# Patient Record
Sex: Female | Born: 1975 | Race: Black or African American | Hispanic: No | Marital: Single | Smoking: Never smoker
Health system: Southern US, Community
[De-identification: ages and names within clinical notes are randomized; demographics above are authoritative.]

## PROBLEM LIST (undated history)

## (undated) DIAGNOSIS — N809 Endometriosis, unspecified: Secondary | ICD-10-CM

## (undated) DIAGNOSIS — D259 Leiomyoma of uterus, unspecified: Secondary | ICD-10-CM

## (undated) DIAGNOSIS — D649 Anemia, unspecified: Secondary | ICD-10-CM

## (undated) DIAGNOSIS — R7989 Other specified abnormal findings of blood chemistry: Secondary | ICD-10-CM

## (undated) DIAGNOSIS — R7309 Other abnormal glucose: Secondary | ICD-10-CM

## (undated) DIAGNOSIS — N739 Female pelvic inflammatory disease, unspecified: Secondary | ICD-10-CM

## (undated) DIAGNOSIS — A599 Trichomoniasis, unspecified: Secondary | ICD-10-CM

## (undated) HISTORY — DX: Other abnormal glucose: R73.09

## (undated) HISTORY — DX: Anemia, unspecified: D64.9

## (undated) HISTORY — PX: LAPAROSCOPIC ENDOMETRIOSIS FULGURATION: SUR769

## (undated) HISTORY — DX: Other specified abnormal findings of blood chemistry: R79.89

---

## 2009-05-24 ENCOUNTER — Inpatient Hospital Stay (HOSPITAL_COMMUNITY): Admission: AD | Admit: 2009-05-24 | Discharge: 2009-05-24 | Payer: Self-pay | Admitting: Obstetrics & Gynecology

## 2009-06-23 ENCOUNTER — Encounter: Payer: Self-pay | Admitting: Obstetrics and Gynecology

## 2009-06-23 ENCOUNTER — Ambulatory Visit: Payer: Self-pay | Admitting: Obstetrics and Gynecology

## 2009-07-21 ENCOUNTER — Ambulatory Visit: Payer: Self-pay | Admitting: Obstetrics and Gynecology

## 2009-09-18 ENCOUNTER — Emergency Department (HOSPITAL_COMMUNITY): Admission: EM | Admit: 2009-09-18 | Discharge: 2009-09-18 | Payer: Self-pay | Admitting: Emergency Medicine

## 2009-11-02 ENCOUNTER — Emergency Department (HOSPITAL_COMMUNITY): Admission: EM | Admit: 2009-11-02 | Discharge: 2009-11-02 | Payer: Self-pay | Admitting: Emergency Medicine

## 2010-03-30 ENCOUNTER — Inpatient Hospital Stay (HOSPITAL_COMMUNITY): Admission: AD | Admit: 2010-03-30 | Discharge: 2010-03-30 | Payer: Self-pay | Admitting: Family Medicine

## 2010-07-02 ENCOUNTER — Emergency Department (HOSPITAL_COMMUNITY): Admission: EM | Admit: 2010-07-02 | Discharge: 2010-07-02 | Payer: Self-pay | Admitting: Emergency Medicine

## 2010-08-11 ENCOUNTER — Emergency Department (HOSPITAL_COMMUNITY): Admission: EM | Admit: 2010-08-11 | Discharge: 2010-08-12 | Payer: Self-pay | Admitting: Emergency Medicine

## 2011-03-16 LAB — CBC
HCT: 34.5 % — ABNORMAL LOW (ref 36.0–46.0)
Platelets: 305 10*3/uL (ref 150–400)

## 2011-03-16 LAB — GC/CHLAMYDIA PROBE AMP, GENITAL: GC Probe Amp, Genital: NEGATIVE

## 2011-03-16 LAB — COMPREHENSIVE METABOLIC PANEL
ALT: 10 U/L (ref 0–35)
BUN: 12 mg/dL (ref 6–23)
CO2: 27 mEq/L (ref 19–32)
Calcium: 8.7 mg/dL (ref 8.4–10.5)
Chloride: 106 mEq/L (ref 96–112)
GFR calc Af Amer: >60 mL/min (ref >60)
Glucose, Bld: 97 mg/dL (ref 70–99)

## 2011-03-16 LAB — DIFFERENTIAL
Basophils Absolute: 0 10*3/uL (ref 0.0–0.1)
Basophils Relative: 0 % (ref 0–1)
Lymphs Abs: 2.9 10*3/uL (ref 0.7–4.0)
Monocytes Relative: 5 % (ref 3–12)
Neutro Abs: 5.2 10*3/uL (ref 1.7–7.7)
Neutrophils Relative %: 59 % (ref 43–77)

## 2011-03-16 LAB — URINE MICROSCOPIC-ADD ON

## 2011-03-16 LAB — URINALYSIS, ROUTINE W REFLEX MICROSCOPIC
Leukocytes, UA: NEGATIVE
Protein, ur: NEGATIVE mg/dL
Urobilinogen, UA: 1 mg/dL (ref 0.0–1.0)
pH: 6.5 (ref 5.0–8.0)

## 2011-03-16 LAB — POCT PREGNANCY, URINE: Preg Test, Ur: NEGATIVE

## 2011-03-16 LAB — LIPASE, BLOOD: Lipase: 59 U/L (ref 11–59)

## 2011-03-16 LAB — WET PREP, GENITAL

## 2011-03-26 LAB — URINALYSIS, ROUTINE W REFLEX MICROSCOPIC
Bilirubin Urine: NEGATIVE
Glucose, UA: NEGATIVE mg/dL
Hgb urine dipstick: NEGATIVE
Ketones, ur: NEGATIVE mg/dL
Nitrite: NEGATIVE
Urobilinogen, UA: 0.2 mg/dL (ref 0.0–1.0)
pH: 6 (ref 5.0–8.0)

## 2011-03-26 LAB — CBC
MCV: 83.2 fL (ref 78.0–100.0)
Platelets: 294 10*3/uL (ref 150–400)
RDW: 14.4 % (ref 11.5–15.5)

## 2011-03-26 LAB — WET PREP, GENITAL
Clue Cells Wet Prep HPF POC: NONE SEEN
Trich, Wet Prep: NONE SEEN
Yeast Wet Prep HPF POC: NONE SEEN

## 2011-03-26 LAB — DIFFERENTIAL
Eosinophils Absolute: 0.1 10*3/uL (ref 0.0–0.7)
Eosinophils Relative: 1 % (ref 0–5)
Monocytes Relative: 4 % (ref 3–12)
Neutro Abs: 6.5 10*3/uL (ref 1.7–7.7)
Neutrophils Relative %: 68 % (ref 43–77)

## 2011-04-04 LAB — URINALYSIS, ROUTINE W REFLEX MICROSCOPIC
Glucose, UA: NEGATIVE mg/dL
Ketones, ur: 15 mg/dL — AB
Nitrite: NEGATIVE
Protein, ur: NEGATIVE mg/dL
Urobilinogen, UA: 1 mg/dL (ref 0.0–1.0)

## 2011-04-04 LAB — WET PREP, GENITAL: Yeast Wet Prep HPF POC: NONE SEEN

## 2011-04-04 LAB — URINE MICROSCOPIC-ADD ON

## 2011-04-04 LAB — GC/CHLAMYDIA PROBE AMP, GENITAL: GC Probe Amp, Genital: NEGATIVE

## 2011-04-10 LAB — WET PREP, GENITAL
Trich, Wet Prep: NONE SEEN
Yeast Wet Prep HPF POC: NONE SEEN

## 2011-04-10 LAB — GC/CHLAMYDIA PROBE AMP, GENITAL
Chlamydia, DNA Probe: NEGATIVE
GC Probe Amp, Genital: NEGATIVE

## 2011-04-10 LAB — URINALYSIS, ROUTINE W REFLEX MICROSCOPIC
Bilirubin Urine: NEGATIVE
Glucose, UA: NEGATIVE mg/dL
Ketones, ur: NEGATIVE mg/dL
Protein, ur: NEGATIVE mg/dL
pH: 5 (ref 5.0–8.0)

## 2011-05-15 NOTE — Group Therapy Note (Signed)
Dawn Lyons, Dawn Lyons NO.:  [id number]   MEDICAL RECORD NO.:  [id number]          PATIENT TYPE:  WOC   LOCATION:  [facility]                   FACILITY:  [facility]   PHYSICIAN:  Wynelle Bourgeois, CNM    DATE OF BIRTH:  Jan 18, 1976   DATE OF SERVICE:  07/21/2009                                  CLINIC NOTE   This is a return followup visit for a 35 year old, gravida 0, who  presented in June with complaints of dyspareunia, menorrhagia, and  dysmenorrhea for 20-month.  Her primary complaint had been dyspareunia.  She had a past history of endometriosis treated with a laparoscopy in  2001 with no further treatment since then.  She had a 5-month history of  what she believes to be menorrhagia which was a 3-day period with heavy  bleeding on the first day.  She had believed that this was heavier  bleeding than she has ever had, and so she was given options by Dr. Okey Dupre  of laparoscopy Depo-Lupron and Depo-Provera.  She returns today to  discuss her decision.  She has nail after reviewing all of the options.  Decided to proceed with Depo-Provera for her treatment.  Her last  menstrual period this month was on July 16, 2009, and was not as heavy  as it was felt the month before, and she had only her usual amount of  dysmenorrhea.  She has not had intercourse since her last visit, so this  could not address dyspareunia, but [facility], and will return here in 2 months to  evaluate the effectiveness of said treatment and the patient has no  further questions.           ______________________________  Wynelle Bourgeois, CNM     MW/MEDQ  D:  07/21/2009  T:  07/22/2009  Job:  [id number]

## 2011-05-15 NOTE — Group Therapy Note (Signed)
NAME:  Dawn Lyons, Dawn Lyons NO.:  [id number]   MEDICAL RECORD NO.:  [id number]          PATIENT TYPE:  WOC   LOCATION:  [facility]                   FACILITY:  [facility]   PHYSICIAN:  Wynelle Bourgeois, CNM    DATE OF BIRTH:  Aug 27, 1976   DATE OF SERVICE:                                  CLINIC NOTE   SUBJECTIVE DATA:  This is a 35 year old gravida 0 who presents to the  clinic with complaints of dyspareunia, as well as menorrhagia and  dysmenorrhea x1 month.  Her primary complaint is dyspareunia.  She has a  past history of endometriosis which was treated with laparoscopy in 2001  but has had no further treatment since then.  She has a history of  starting her menses at age 81 and has always had dysmenorrhea with her  periods but the 1 episode of heavier bleeding occurred this past month  with a total period of 3 days but she believed that the flow was heavier  than normal necessitating pad change every 3 hours and the emergence of  dyspareunia bilaterally over the last month.  She is in a same-sex  relationship and reports having dyspareunia every time she has  intercourse with her partner.  She has other history remarkable for  trichomoniasis in the remote past and history of ovarian cysts in the  past.  Her only surgery was laparoscopy in 2001.  She has not had a Pap  in several years.   OBJECTIVE DATA:  VITAL SIGNS:  Stable.  Afebrile.  Blood pressure  115/75.  Weight is 164 with a height of 5.2.  HEENT:  Within normal limits.  Thyroid normal.  CHEST:  Clear.  HEART:  Regular rate and rhythm.  NEURO:  Grossly intact with normal DTRs.  ABDOMEN:  Soft and nontender, nondistended with positive bowel sounds.  EXTREMITIES:  Normal.  PELVIC:  Exam is remarkable for normal vaginal rugae with nullip cervix.  Pap smear was obtained.  Pelvic bimanual exam shows a normal anteverted  uterus, slightly enlarged at about a 6-week size.  Bilateral ovaries are  not readily palpable.   There is tenderness in bilateral adnexa.  There  is no cervical motion tenderness.  RECTAL:  Deferred.   ASSESSMENT:  1. Nullipara with history of endometriosis.  2. Dyspareunia, new.  3. Menorrhagia x1 month.  4. Dysmenorrhea.   PLAN:  Dr. Okey Dupre was in to counsel with the patient.  His recommendations  began with outlining possible treatments for these issues which are felt  to be related to her endometriosis which has probably progressed in the  years since her surgery.  He discussed possibilities of surgery which is  not recommended at this time as it would probably provide only temporary  relief.  He also discussed the possibility of using Depot Lupron which  would provide relief for about a year but it is a very expensive  treatment and causes many uncomfortable side effects.  Finally, his  recommendation was to use Depo-Provera for approximately 1 year in order  to allow the endometriosis to regress and possibly give a more permanent  relief of her symptoms.  She has been given the website for the [place] [employer] article on endometriosis and will go home  and discuss with her partner the different options and  return in 2 to 4 weeks with her decision.  Recommendations were also  made to avoid deep penetration with intercourse and use ibuprofen as  needed for pain.           ______________________________  Wynelle Bourgeois, CNM     MW/MEDQ  D:  06/23/2009  T:  06/23/2009  Job:  [id number]

## 2011-07-27 IMAGING — US US PELVIS COMPLETE MODIFY
1 series · 13 of 25 positions shown · non-contrast
Comparison: 05/24/2009

CLINICAL DATA: Abdominal pain.  History of endometriosis.  LMP
02/05/2010

TRANSABDOMINAL AND TRANSVAGINAL ULTRASOUND OF PELVIS
TECHNIQUE: Both transabdominal and transvaginal ultrasound
examinations of the pelvis were performed including evaluation of
the uterus, ovaries, adnexal regions, and pelvic cul-de-sac.

[Series 1: us pelvis complete modify · 0.22mm/px · 66 acquisitions, 13 frames shown]
[im 1/66]
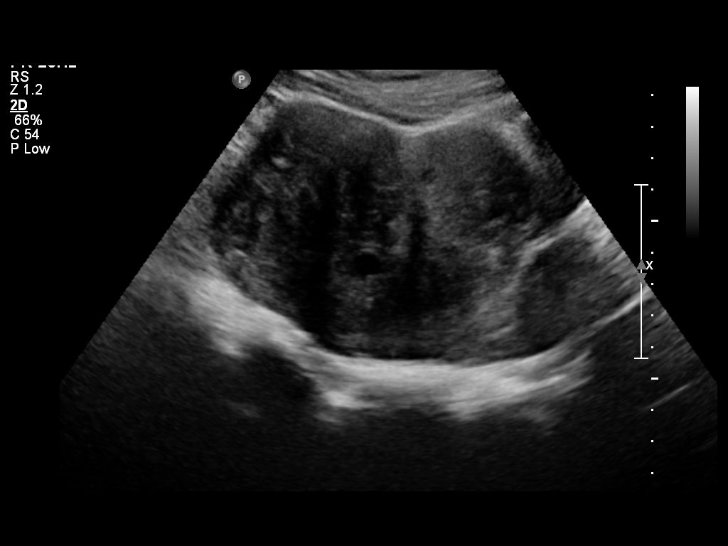
[im 6/66]
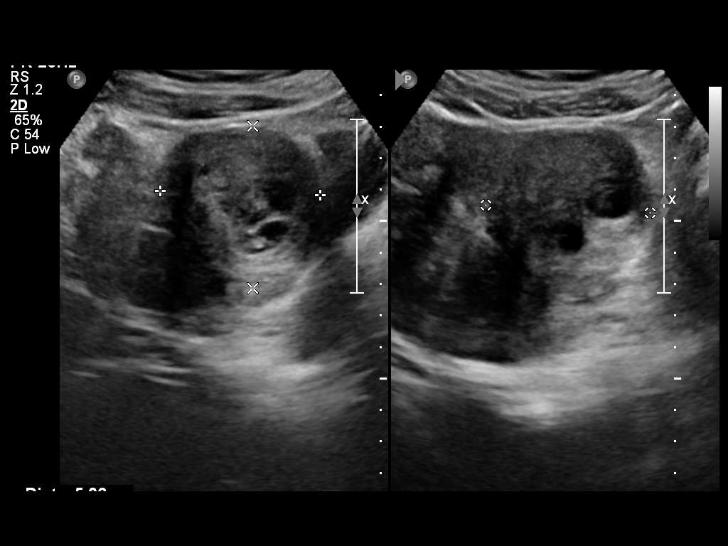
[im 11/66]
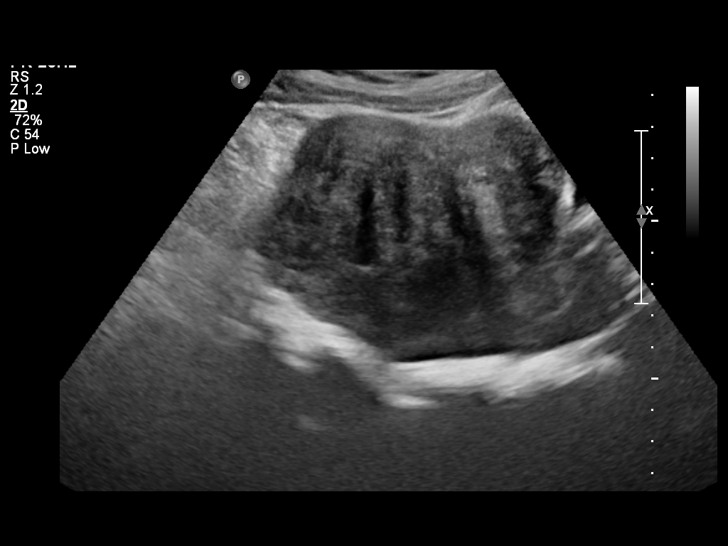
[im 17/66]
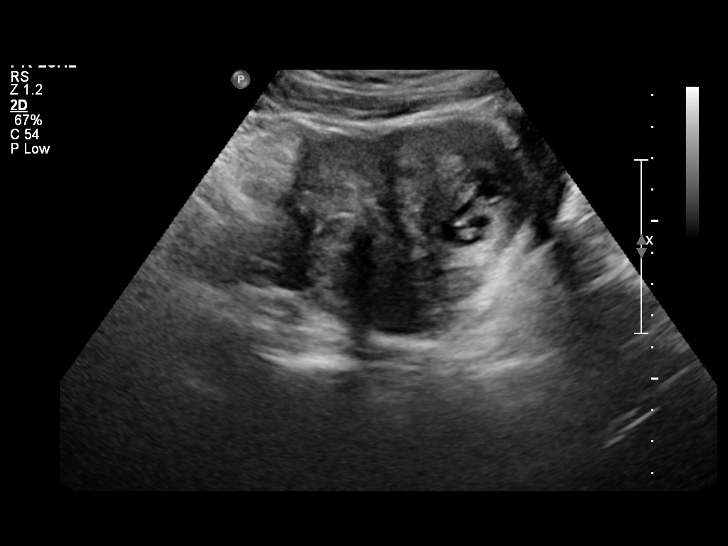
[im 22/66]
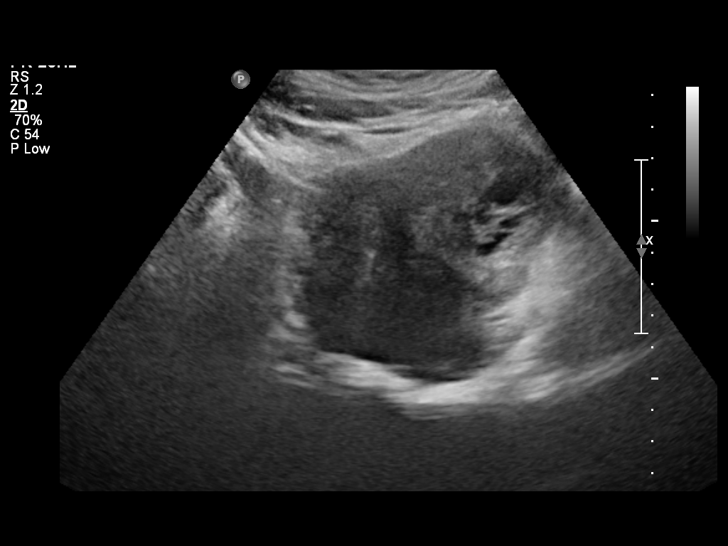
[im 28/66]
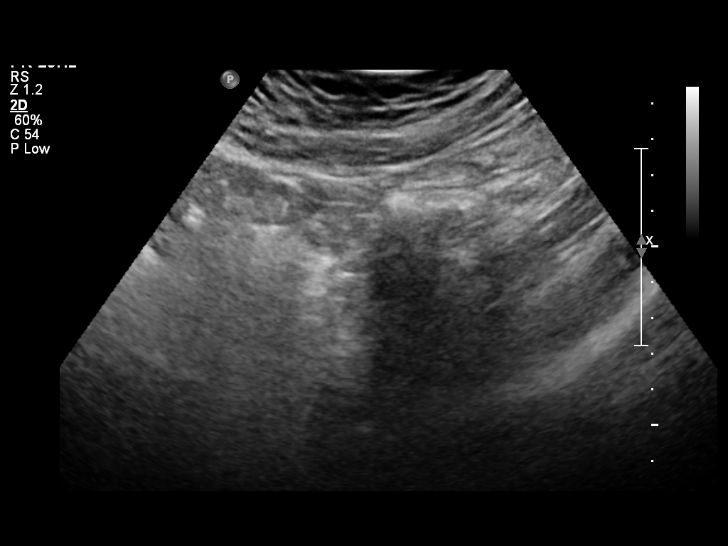
[im 33/66]
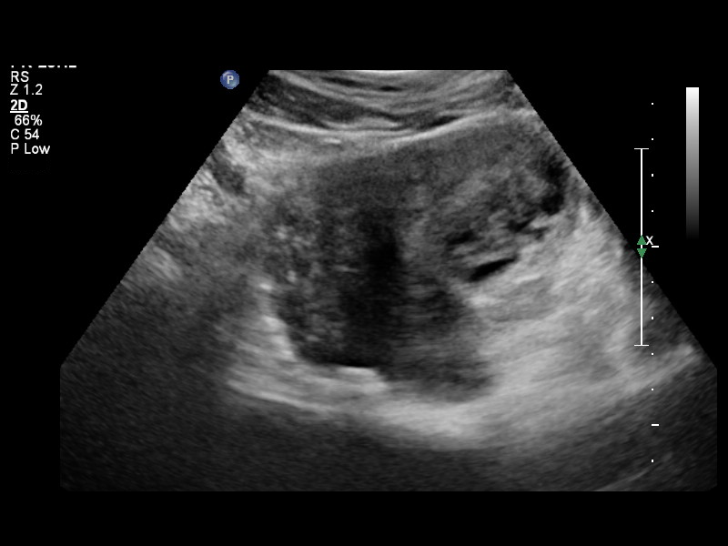
[im 38/66]
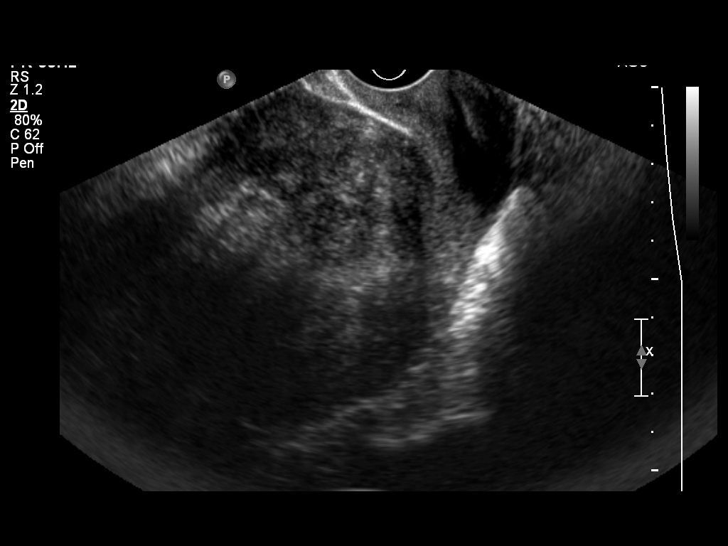
[im 44/66]
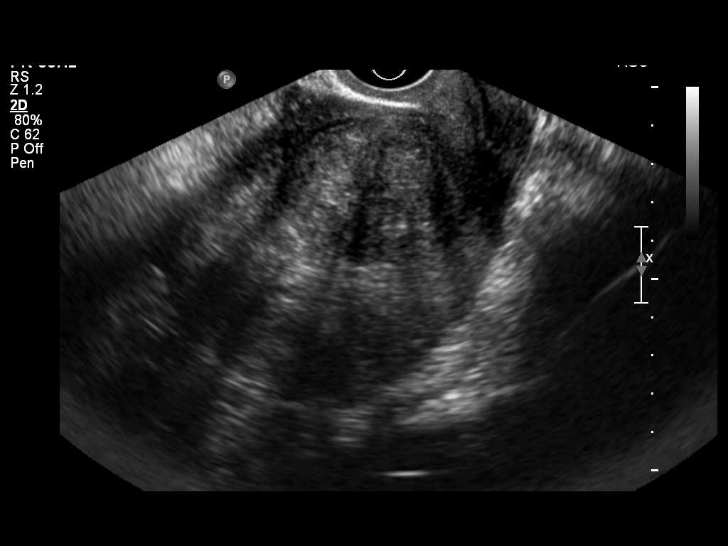
[im 49/66]
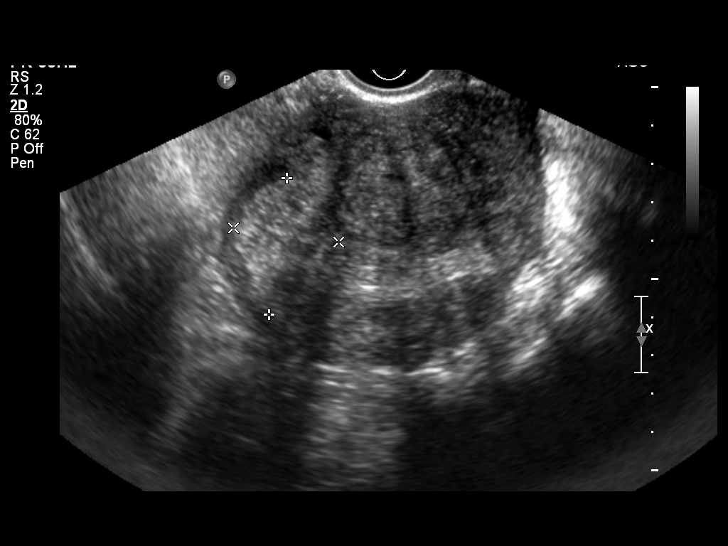
[im 55/66]
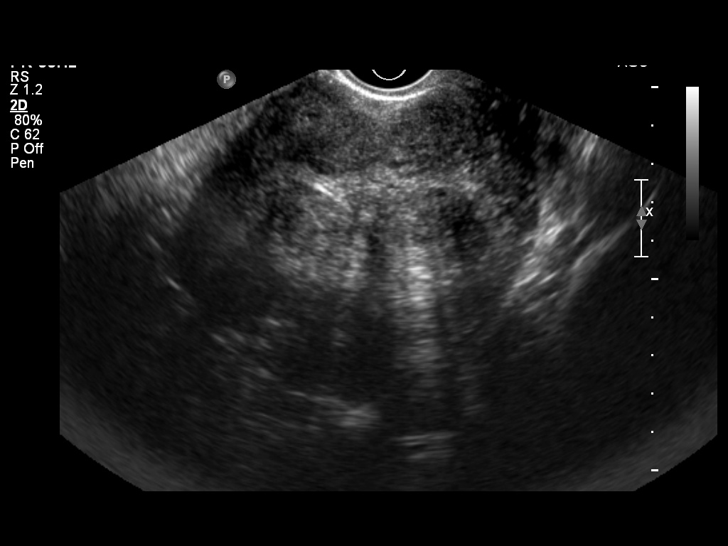
[im 60/66]
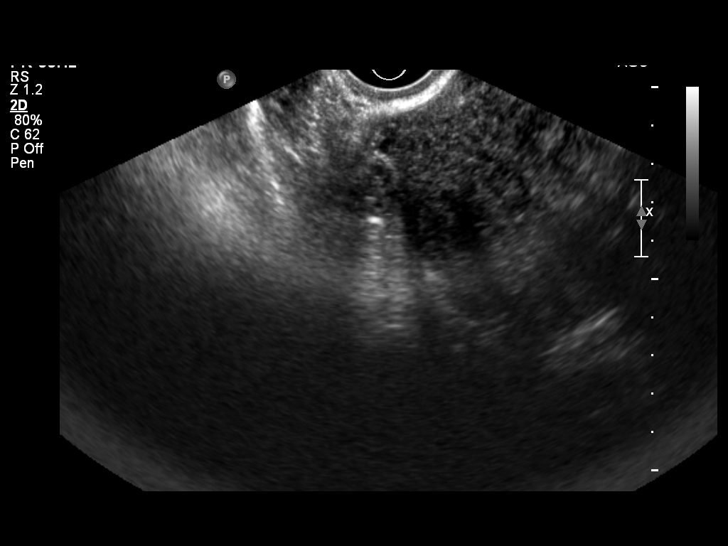
[im 66/66]
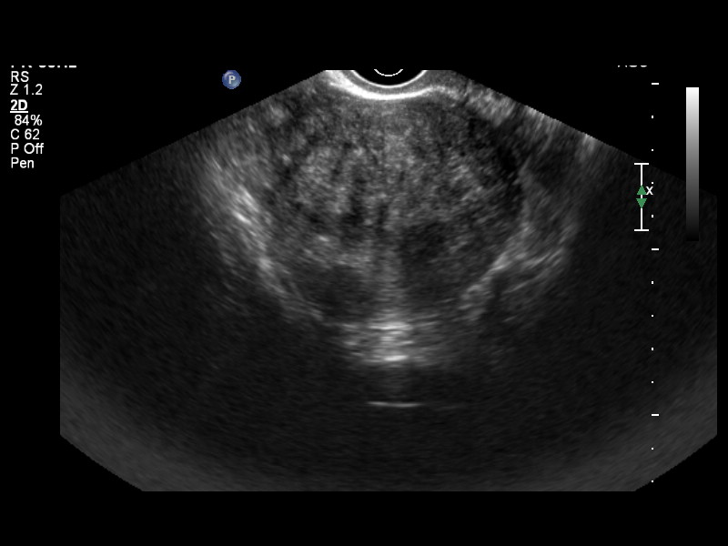

[13 of 25 positions shown; findings below may reference images not displayed]

FINDINGS: Uterus the uterus is enlarged with a sagittal length of 11.2 cm, an
AP width of 7.9 cm and a transverse width of 9.5 cm.  There are
multiple focal fibroids identified with size and locations as
noted:  In the left lateral mid body measuring 5.1 x 5.1 x 5.2 cm
and demonstrating some central degeneration; in the right
posterolateral fundal region measuring 3.3 x 3.4 x 3.1 cm and in
the left lateral upper uterine segment with a partial subserosal
component measuring 2.2 x 2.4 x 2.4 cm.  More diffuse fibroid
involvement is suspected.

Endometrium is poorly delineated secondary to the fibroid load

Right Ovary has a normal appearance transvaginally measuring 3.7 x
1.6 x 1.8 cm

Left Ovary seen only transabdominally measuring 3.2 x 2.1 x 2.5 cm

Other Findings:  No pelvic fluid or separate adnexal masses are
seen.
IMPRESSION: Fibroid uterus with measurable fibroid sizes and locations as noted
above.  The largest fibroid demonstrates some central necrosis
which is new in comparison with prior exam. More diffuse fibroid
involvement is suspected.

Endometrial assessment precluded by fibroid load.

Normal ovaries.

## 2013-02-15 ENCOUNTER — Encounter (HOSPITAL_COMMUNITY): Payer: Self-pay | Admitting: Emergency Medicine

## 2013-02-15 ENCOUNTER — Emergency Department (HOSPITAL_COMMUNITY)
Admission: EM | Admit: 2013-02-15 | Discharge: 2013-02-15 | Disposition: A | Discharge status: Home / Self Care | Payer: Self-pay | Attending: Emergency Medicine | Admitting: Emergency Medicine

## 2013-02-15 DIAGNOSIS — A5901 Trichomonal vulvovaginitis: Secondary | ICD-10-CM

## 2013-02-15 DIAGNOSIS — Z79899 Other long term (current) drug therapy: Secondary | ICD-10-CM | POA: Insufficient documentation

## 2013-02-15 DIAGNOSIS — N760 Acute vaginitis: Secondary | ICD-10-CM

## 2013-02-15 DIAGNOSIS — Z3202 Encounter for pregnancy test, result negative: Secondary | ICD-10-CM | POA: Insufficient documentation

## 2013-02-15 DIAGNOSIS — Z8742 Personal history of other diseases of the female genital tract: Secondary | ICD-10-CM | POA: Insufficient documentation

## 2013-02-15 HISTORY — DX: Endometriosis, unspecified: N80.9

## 2013-02-15 HISTORY — DX: Female pelvic inflammatory disease, unspecified: N73.9

## 2013-02-15 LAB — URINALYSIS, ROUTINE W REFLEX MICROSCOPIC
Hgb urine dipstick: NEGATIVE
Ketones, ur: 15 mg/dL — AB
Nitrite: NEGATIVE
Protein, ur: NEGATIVE mg/dL
Urobilinogen, UA: 0.2 mg/dL (ref 0.0–1.0)

## 2013-02-15 LAB — POCT PREGNANCY, URINE: Preg Test, Ur: NEGATIVE

## 2013-02-15 LAB — URINE MICROSCOPIC-ADD ON

## 2013-02-15 LAB — WET PREP, GENITAL: Clue Cells Wet Prep HPF POC: NONE SEEN

## 2013-02-15 MED ORDER — LIDOCAINE HCL (PF) 1 % IJ SOLN
INTRAMUSCULAR | Status: AC
  Filled 2013-02-15: qty 5

## 2013-02-15 MED ORDER — CEFTRIAXONE SODIUM 250 MG IJ SOLR
250.0000 mg | Freq: Once | INTRAMUSCULAR | Status: AC
  Administered 2013-02-15: 250 mg via INTRAMUSCULAR
  Filled 2013-02-15: qty 250

## 2013-02-15 MED ORDER — METRONIDAZOLE 500 MG PO TABS: 500.0000 mg | ORAL_TABLET | Freq: Two times a day (BID) | ORAL | Status: DC

## 2013-02-15 MED ORDER — AZITHROMYCIN 250 MG PO TABS
1000.0000 mg | ORAL_TABLET | Freq: Once | ORAL | Status: AC
  Administered 2013-02-15: 1000 mg via ORAL
  Filled 2013-02-15: qty 4

## 2013-02-15 NOTE — ED Provider Notes (Signed)
History     CSN: [id number]  Arrival date & time 02/15/13  1129   First MD Initiated Contact with Patient 02/15/13 1217      Chief Complaint  Patient presents with  . Vaginal Discharge    (Consider location/radiation/quality/duration/timing/severity/associated sxs/prior treatment) Patient is a 37 y.o. female presenting with vaginal discharge. The history is provided by the patient.  Vaginal Discharge Pertinent negatives include no abdominal pain.  pt c/o vaginal discharge for the past 2-3 weeks, brownish. Also c/o mild vaginal pain/irritation, worse w   intercourse. Hx uterine fibroids. No hx ovarian cysts or endometriosis. Denies known std exposure. No abd pain. No flank pain. No dysuria or hematuria. lnmp 2 weeks ago. Denies fever or chills. No nv.     Past Medical History  Diagnosis Date  . Endometriosis   . PID (pelvic inflammatory disease)     Past Surgical History  Procedure Laterality Date  . Laparoscopic endometriosis fulguration      No family history on file.  History  Substance Use Topics  . Smoking status: Never Smoker   . Smokeless tobacco: Not on file  . Alcohol Use: No    OB History   Grav Para Term Preterm Abortions TAB SAB Ect Mult Living                  Review of Systems  Constitutional: Negative for fever and chills.  HENT: Negative for sore throat.   Eyes: Negative for redness.  Gastrointestinal: Negative for vomiting, abdominal pain, diarrhea, constipation and abdominal distention.  Endocrine: Negative for polyuria.  Genitourinary: Positive for vaginal discharge. Negative for dysuria and flank pain.  Skin: Negative for rash.    Allergies  Review of patient's allergies indicates no known allergies.  Home Medications   Current Outpatient Rx  Name  Route  Sig  Dispense  Refill  . naproxen sodium (ANAPROX) 220 MG tablet   Oral   Take 440 mg by mouth daily as needed (headache).           BP 131/81  Pulse 83  Temp(Src) 98.1 F  (36.7 C) (Oral)  Resp 18  SpO2 100%  LMP 02/10/2013  Physical Exam  Nursing note and vitals reviewed. Constitutional: She appears well-developed and well-nourished. No distress.  Eyes: Conjunctivae are normal. No scleral icterus.  Neck: Neck supple. No tracheal deviation present.  Cardiovascular: Normal rate.   Pulmonary/Chest: Effort normal. No respiratory distress.  Abdominal: Soft. Normal appearance and bowel sounds are normal. She exhibits no distension and no mass. There is no tenderness. There is no rebound and no guarding.  Genitourinary:  No cva tenderness. Normal ext exam. Whitish/yellow d/c. Cervix closed. No cmt. No adx masses/ tenderness  Musculoskeletal: She exhibits no edema.  Neurological: She is alert.  Skin: Skin is warm and dry. No rash noted.  Psychiatric: She has a normal mood and affect.    ED Course  Procedures (including critical care time)   Results for orders placed during the hospital encounter of 02/15/13  WET PREP, GENITAL      Result Value Range   Yeast Wet Prep HPF POC NONE SEEN  NONE SEEN   Trich, Wet Prep MANY (*) NONE SEEN   Clue Cells Wet Prep HPF POC NONE SEEN  NONE SEEN   WBC, Wet Prep HPF POC FEW (*) NONE SEEN  POCT PREGNANCY, URINE      Result Value Range   Preg Test, Ur NEGATIVE  NEGATIVE  MDM  Labs.  Pelvic.  Reviewed nursing notes and prior charts for additional history.   Rocephin im, zithromax po.   rx flagyl for trich.  pcp f/u.   abd  Soft nt. No nv. Pt appears stable for d/c.      Suzi Roots, MD 02/15/13 1444

## 2013-02-15 NOTE — ED Notes (Addendum)
Pt c/o brown vaginal discharge and painful intercourse for about 3 weeks. Pt reports sour odor smell noted and urinary frequency. Pt has new sexual partner.

## 2013-02-15 NOTE — ED Notes (Signed)
States "I have abd pain and a funky discharge." Pt states d/c is brown and has foul odor. Pt states she is also having "pressure pain with intercourse."

## 2013-02-16 LAB — GC/CHLAMYDIA PROBE AMP: GC Probe RNA: NEGATIVE

## 2013-04-03 ENCOUNTER — Emergency Department (HOSPITAL_COMMUNITY)
Admission: EM | Admit: 2013-04-03 | Discharge: 2013-04-04 | Disposition: A | Discharge status: Home / Self Care | Payer: Self-pay | Attending: Emergency Medicine | Admitting: Emergency Medicine

## 2013-04-03 ENCOUNTER — Encounter (HOSPITAL_COMMUNITY): Payer: Self-pay | Admitting: *Deleted

## 2013-04-03 DIAGNOSIS — A599 Trichomoniasis, unspecified: Secondary | ICD-10-CM

## 2013-04-03 DIAGNOSIS — Z3202 Encounter for pregnancy test, result negative: Secondary | ICD-10-CM | POA: Insufficient documentation

## 2013-04-03 DIAGNOSIS — Z113 Encounter for screening for infections with a predominantly sexual mode of transmission: Secondary | ICD-10-CM | POA: Insufficient documentation

## 2013-04-03 DIAGNOSIS — A5901 Trichomonal vulvovaginitis: Secondary | ICD-10-CM | POA: Insufficient documentation

## 2013-04-03 DIAGNOSIS — R102 Pelvic and perineal pain: Secondary | ICD-10-CM

## 2013-04-03 DIAGNOSIS — R110 Nausea: Secondary | ICD-10-CM | POA: Insufficient documentation

## 2013-04-03 DIAGNOSIS — Z8742 Personal history of other diseases of the female genital tract: Secondary | ICD-10-CM | POA: Insufficient documentation

## 2013-04-03 DIAGNOSIS — N949 Unspecified condition associated with female genital organs and menstrual cycle: Secondary | ICD-10-CM | POA: Insufficient documentation

## 2013-04-03 HISTORY — DX: Trichomoniasis, unspecified: A59.9

## 2013-04-03 LAB — URINALYSIS, ROUTINE W REFLEX MICROSCOPIC
Bilirubin Urine: NEGATIVE
Ketones, ur: NEGATIVE mg/dL
Nitrite: NEGATIVE
Protein, ur: NEGATIVE mg/dL
pH: 5 (ref 5.0–8.0)

## 2013-04-03 NOTE — ED Notes (Signed)
Pt called for room X2 

## 2013-04-03 NOTE — ED Provider Notes (Signed)
History     CSN: [id number]  Arrival date & time 04/03/13  1932   First MD Initiated Contact with Patient 04/03/13 2213      Chief Complaint  Patient presents with  . Vaginal Discharge    (Consider location/radiation/quality/duration/timing/severity/associated sxs/prior treatment) Patient is a 37 y.o. female presenting with vaginal discharge. The history is provided by the patient, medical records and a significant other. No language interpreter was used.  Vaginal Discharge This is a new problem. The current episode started in the past 7 days. The problem occurs constantly. The problem has been gradually worsening. Associated symptoms include nausea. Pertinent negatives include no chest pain, diaphoresis, fatigue, fever, neck pain, rash, urinary symptoms, vomiting or weakness. Nothing aggravates the symptoms. She has tried nothing for the symptoms. The treatment provided no relief.   Dawn Lyons is a 37 y.o. female  with a hx of STD, PID presents to the Emergency Department complaining of gradual, persistent, gradually worsening vaginal malodorous discharge.  Patient states she was seen and treated for trichomoniasis 2 weeks ago but her partner was not treated and they have continued to have unprotected intercourse. She is sexually active with 1 female partner the last 3 months and they do not use protection. Associated symptoms include intermittent, resolved pelvic pain.  Nothing makes it better and nothing makes it worse.  Pt denies fever, chills, neck pain, chest pain, shortness of breath, abdominal pain, nausea, vomiting, diarrhea to weakness, dizziness, dysuria, hematuria, syncope.     Past Medical History  Diagnosis Date  . Endometriosis   . PID (pelvic inflammatory disease)   . Trichimoniasis     Past Surgical History  Procedure Laterality Date  . Laparoscopic endometriosis fulguration      History reviewed. No pertinent family history.  History  Substance Use Topics  .  Smoking status: Never Smoker   . Smokeless tobacco: Not on file  . Alcohol Use: No    OB History   Grav Para Term Preterm Abortions TAB SAB Ect Mult Living                  Review of Systems  Constitutional: Negative for fever, diaphoresis, appetite change, fatigue and unexpected weight change.  HENT: Negative for neck pain and neck stiffness.   Respiratory: Negative for chest tightness and shortness of breath.   Cardiovascular: Negative for chest pain.  Gastrointestinal: Positive for nausea. Negative for vomiting, diarrhea, constipation, blood in stool, abdominal distention and rectal pain.  Genitourinary: Positive for vaginal discharge and pelvic pain (intermittent). Negative for dysuria, urgency, frequency, hematuria, flank pain, vaginal bleeding, difficulty urinating, vaginal pain and menstrual problem.  Musculoskeletal: Negative for back pain.  Skin: Negative for rash.  Neurological: Negative for dizziness, weakness and light-headedness.    Allergies  Review of patient's allergies indicates no known allergies.  Home Medications   Current Outpatient Rx  Name  Route  Sig  Dispense  Refill  . naproxen sodium (ANAPROX) 220 MG tablet   Oral   Take 440 mg by mouth daily as needed (headache).           BP 106/69  Pulse 90  Temp(Src) 98.4 F (36.9 C) (Oral)  Resp 18  SpO2 99%  LMP 03/27/2013  Physical Exam  Nursing note and vitals reviewed. Constitutional: She appears well-developed and well-nourished. No distress.  HENT:  Head: Normocephalic and atraumatic.  Mouth/Throat: Oropharynx is clear and moist.  Eyes: Conjunctivae and EOM are normal. Pupils are equal, round, and  reactive to light. No scleral icterus.  Neck: Normal range of motion. Neck supple.  Cardiovascular: Normal rate, regular rhythm, normal heart sounds and intact distal pulses.  Exam reveals no gallop and no friction rub.   No murmur heard. Pulmonary/Chest: Effort normal and breath sounds normal. No  respiratory distress. She has no wheezes. She has no rales. She exhibits no tenderness.  Abdominal: Soft. Bowel sounds are normal. She exhibits no distension and no mass. There is no hepatosplenomegaly. There is no tenderness. There is no rigidity, no rebound, no guarding and no CVA tenderness. Hernia confirmed negative in the right inguinal area and confirmed negative in the left inguinal area.  Genitourinary: Uterus normal. Pelvic exam was performed with patient prone. There is no rash, tenderness or lesion on the right labia. There is no rash, tenderness or lesion on the left labia. Uterus is not deviated, not enlarged, not fixed and not tender. Cervix exhibits no motion tenderness, no discharge and no friability. Right adnexum displays no mass and no tenderness. Left adnexum displays no mass and no tenderness. No erythema, tenderness or bleeding around the vagina. No foreign body around the vagina. No signs of injury around the vagina. Vaginal discharge (green, frothy, copious) found.  Musculoskeletal: Normal range of motion. She exhibits no edema.  Lymphadenopathy:    She has no cervical adenopathy.       Right: No inguinal adenopathy present.       Left: No inguinal adenopathy present.  Neurological: She is alert.  Speech is clear and goal oriented Moves extremities without ataxia  Skin: Skin is warm and dry. No rash noted. She is not diaphoretic.  Psychiatric: She has a normal mood and affect.    ED Course  Procedures (including critical care time)  Labs Reviewed  WET PREP, GENITAL - Abnormal; Notable for the following:    Trich, Wet Prep MANY (*)    Clue Cells Wet Prep HPF POC MANY (*)    WBC, Wet Prep HPF POC TOO NUMEROUS TO COUNT (*)    All other components within normal limits  GC/CHLAMYDIA PROBE AMP  URINALYSIS, ROUTINE W REFLEX MICROSCOPIC  POCT PREGNANCY, URINE   No results found.   1. Trichomonas   2. Screening examination for STD (sexually transmitted disease)   3.  Pelvic pain       MDM  Dawn Lyons presents with pelvic pain and STD exposure.  Patient treated for trichomoniasis and several weeks ago.  Her partner was never treated.  Patient with positive trichomoniasis and white blood cells too numerous to count on wet prep.  Likely further STD exposure. Pt understands that they have GC/Chlamydia cultures pending and that they will need to inform all sexual partners if results return positive. Pt has been treated prophylacticly with azithromycin and rocephin due to pts history, pelvic exam, and wet prep with increased WBCs. Patient also treated for trichomoniasis. Pt not concerning for PID because hemodynamically stable and no cervical motion tenderness on pelvic exam. Pt has also been treated with flagyl for Bacterial Vaginosis. Pt has been advised to not drink alcohol while on this medication. Patient to be discharged with instructions to follow up with OBGYN and/or obtain a primary care physician. Discussed importance of using protection when sexually active.         Dahlia Client Shawntee Mainwaring, PA-C 04/04/13 [phone]

## 2013-04-03 NOTE — ED Notes (Signed)
Pt c/o brown foul discharge x 2 weeks.  Also c/o lower abdominal pain, is nauseous but denies n/v. No CP, SOB, fever/chills.

## 2013-04-04 LAB — WET PREP, GENITAL

## 2013-04-04 MED ORDER — AZITHROMYCIN 250 MG PO TABS
1000.0000 mg | ORAL_TABLET | Freq: Once | ORAL | Status: AC
  Administered 2013-04-04: 1000 mg via ORAL
  Filled 2013-04-04: qty 4

## 2013-04-04 MED ORDER — LIDOCAINE HCL (PF) 1 % IJ SOLN
INTRAMUSCULAR | Status: AC
  Filled 2013-04-04: qty 5

## 2013-04-04 MED ORDER — METRONIDAZOLE 500 MG PO TABS
2000.0000 mg | ORAL_TABLET | Freq: Once | ORAL | Status: AC
  Administered 2013-04-04: 2000 mg via ORAL
  Filled 2013-04-04: qty 4

## 2013-04-04 MED ORDER — CEFTRIAXONE SODIUM 250 MG IJ SOLR
250.0000 mg | Freq: Once | INTRAMUSCULAR | Status: AC
  Administered 2013-04-04: 250 mg via INTRAMUSCULAR
  Filled 2013-04-04: qty 250

## 2013-04-04 NOTE — ED Provider Notes (Signed)
  Medical screening examination/treatment/procedure(s) were performed by non-physician practitioner and as supervising physician I was immediately available for consultation/collaboration.    Gerhard Munch, MD 04/04/13 [phone]

## 2013-04-05 LAB — GC/CHLAMYDIA PROBE AMP: GC Probe RNA: NEGATIVE

## 2013-05-27 ENCOUNTER — Emergency Department (HOSPITAL_COMMUNITY)
Admission: EM | Admit: 2013-05-27 | Discharge: 2013-05-27 | Disposition: A | Discharge status: Home / Self Care | Payer: Self-pay | Attending: Emergency Medicine | Admitting: Emergency Medicine

## 2013-05-27 ENCOUNTER — Encounter (HOSPITAL_COMMUNITY): Payer: Self-pay | Admitting: Physical Medicine and Rehabilitation

## 2013-05-27 DIAGNOSIS — Z3202 Encounter for pregnancy test, result negative: Secondary | ICD-10-CM | POA: Insufficient documentation

## 2013-05-27 DIAGNOSIS — Y9289 Other specified places as the place of occurrence of the external cause: Secondary | ICD-10-CM | POA: Insufficient documentation

## 2013-05-27 DIAGNOSIS — N898 Other specified noninflammatory disorders of vagina: Secondary | ICD-10-CM | POA: Insufficient documentation

## 2013-05-27 DIAGNOSIS — N390 Urinary tract infection, site not specified: Secondary | ICD-10-CM | POA: Insufficient documentation

## 2013-05-27 DIAGNOSIS — Y990 Civilian activity done for income or pay: Secondary | ICD-10-CM | POA: Insufficient documentation

## 2013-05-27 DIAGNOSIS — Y9389 Activity, other specified: Secondary | ICD-10-CM | POA: Insufficient documentation

## 2013-05-27 DIAGNOSIS — S51809A Unspecified open wound of unspecified forearm, initial encounter: Secondary | ICD-10-CM | POA: Insufficient documentation

## 2013-05-27 DIAGNOSIS — A599 Trichomoniasis, unspecified: Secondary | ICD-10-CM

## 2013-05-27 DIAGNOSIS — R109 Unspecified abdominal pain: Secondary | ICD-10-CM | POA: Insufficient documentation

## 2013-05-27 DIAGNOSIS — A5901 Trichomonal vulvovaginitis: Secondary | ICD-10-CM | POA: Insufficient documentation

## 2013-05-27 DIAGNOSIS — Z8619 Personal history of other infectious and parasitic diseases: Secondary | ICD-10-CM | POA: Insufficient documentation

## 2013-05-27 DIAGNOSIS — S51812A Laceration without foreign body of left forearm, initial encounter: Secondary | ICD-10-CM

## 2013-05-27 DIAGNOSIS — Z23 Encounter for immunization: Secondary | ICD-10-CM | POA: Insufficient documentation

## 2013-05-27 DIAGNOSIS — W278XXA Contact with other nonpowered hand tool, initial encounter: Secondary | ICD-10-CM | POA: Insufficient documentation

## 2013-05-27 DIAGNOSIS — Z8742 Personal history of other diseases of the female genital tract: Secondary | ICD-10-CM | POA: Insufficient documentation

## 2013-05-27 LAB — URINE MICROSCOPIC-ADD ON

## 2013-05-27 LAB — URINALYSIS, ROUTINE W REFLEX MICROSCOPIC
Bilirubin Urine: NEGATIVE
Hgb urine dipstick: NEGATIVE
Ketones, ur: NEGATIVE mg/dL
Nitrite: NEGATIVE
Urobilinogen, UA: 0.2 mg/dL (ref 0.0–1.0)

## 2013-05-27 LAB — WET PREP, GENITAL
Clue Cells Wet Prep HPF POC: NONE SEEN
Yeast Wet Prep HPF POC: NONE SEEN

## 2013-05-27 MED ORDER — NITROFURANTOIN MONOHYD MACRO 100 MG PO CAPS: 100.0000 mg | ORAL_CAPSULE | Freq: Two times a day (BID) | ORAL | Status: DC

## 2013-05-27 MED ORDER — AZITHROMYCIN 250 MG PO TABS
1000.0000 mg | ORAL_TABLET | Freq: Once | ORAL | Status: AC
  Administered 2013-05-27: 1000 mg via ORAL
  Filled 2013-05-27: qty 4

## 2013-05-27 MED ORDER — LIDOCAINE HCL (PF) 1 % IJ SOLN
INTRAMUSCULAR | Status: AC
  Filled 2013-05-27: qty 5

## 2013-05-27 MED ORDER — METRONIDAZOLE 500 MG PO TABS
2000.0000 mg | ORAL_TABLET | Freq: Once | ORAL | Status: AC
  Administered 2013-05-27: 2000 mg via ORAL
  Filled 2013-05-27: qty 4

## 2013-05-27 MED ORDER — CEFTRIAXONE SODIUM 250 MG IJ SOLR
250.0000 mg | Freq: Once | INTRAMUSCULAR | Status: AC
  Administered 2013-05-27: 250 mg via INTRAMUSCULAR
  Filled 2013-05-27: qty 250

## 2013-05-27 MED ORDER — PHENAZOPYRIDINE HCL 200 MG PO TABS: 200.0000 mg | ORAL_TABLET | Freq: Three times a day (TID) | ORAL | Status: DC

## 2013-05-27 MED ORDER — LIDOCAINE HCL (PF) 1 % IJ SOLN
1.0000 mL | Freq: Once | INTRAMUSCULAR | Status: AC
  Administered 2013-05-27: 1 mL

## 2013-05-27 MED ORDER — TETANUS-DIPHTH-ACELL PERTUSSIS 5-2.5-18.5 LF-MCG/0.5 IM SUSP
0.5000 mL | Freq: Once | INTRAMUSCULAR | Status: AC
  Administered 2013-05-27: 0.5 mL via INTRAMUSCULAR
  Filled 2013-05-27: qty 0.5

## 2013-05-27 NOTE — ED Notes (Signed)
Pt resting quietly at the time. Wound care performed to L arm laceration. Pt tolerated without difficulty. Vital signs stable. No signs of distress noted.

## 2013-05-27 NOTE — ED Provider Notes (Signed)
History     CSN: [id number]  Arrival date & time 05/27/13  0809   First MD Initiated Contact with Patient 05/27/13 [phone]      Chief Complaint  Patient presents with  . Arm Pain  . Vaginal Discharge    (Consider location/radiation/quality/duration/timing/severity/associated sxs/prior treatment) HPI Comments: 37 y.o. Female with PMHx of trichomoniasis and PID presents today with two complaints: small linear laceration to the left forearm that occurred yesterday when a box cutter slipped and cut her arm as she was opening boxes at work. And a brown, foul-smelling discharged that started shortly after her period and has lasted for a few days.   Patient is a 37 y.o. female presenting with arm pain and vaginal discharge.  Arm Pain This is a new problem. The current episode started yesterday. The problem occurs constantly. The problem has been unchanged. Associated symptoms include abdominal pain. Pertinent negatives include no arthralgias, change in bowel habit, chest pain, chills, coughing, fever, headaches, myalgias, nausea, neck pain, numbness, sore throat, visual change or vomiting. The symptoms are aggravated by exertion. She has tried nothing for the symptoms.  Vaginal Discharge Quality:  Manson Passey and malodorous Severity:  Mild Onset quality:  Sudden Timing:  Intermittent Progression:  Unchanged Chronicity:  Recurrent Relieved by:  Nothing Worsened by:  Nothing tried Ineffective treatments:  None tried Associated symptoms: abdominal pain   Associated symptoms: no dysuria, no fever, no nausea and no vomiting   Risk factors comment:  Pt has hx of trichomoniasis and BV   Past Medical History  Diagnosis Date  . Endometriosis   . PID (pelvic inflammatory disease)   . Trichimoniasis     Past Surgical History  Procedure Laterality Date  . Laparoscopic endometriosis fulguration      No family history on file.  History  Substance Use Topics  . Smoking status: Never Smoker   .  Smokeless tobacco: Not on file  . Alcohol Use: No    OB History   Grav Para Term Preterm Abortions TAB SAB Ect Mult Living                  Review of Systems  Constitutional: Negative for fever and chills.  HENT: Negative for sore throat and neck pain.   Eyes: Negative for visual disturbance.  Respiratory: Negative for cough.   Cardiovascular: Negative for chest pain.  Gastrointestinal: Positive for abdominal pain. Negative for nausea, vomiting and change in bowel habit.       Suprapubic  Genitourinary: Positive for vaginal discharge. Negative for dysuria, flank pain and genital sores.       Foul smelling brown discharge  Musculoskeletal: Negative for myalgias, back pain and arthralgias.  Skin: Positive for wound.       Left forearm  Neurological: Negative for numbness and headaches.    Allergies  Review of patient's allergies indicates no known allergies.  Home Medications   Current Outpatient Rx  Name  Route  Sig  Dispense  Refill  . naproxen sodium (ANAPROX) 220 MG tablet   Oral   Take 440 mg by mouth daily as needed (headache).           BP 120/68  Pulse 87  Temp(Src) 98.9 F (37.2 C) (Oral)  Resp 14  SpO2 100%  LMP 05/13/2013  Physical Exam  Nursing note and vitals reviewed. Constitutional: She is oriented to person, place, and time. She appears well-developed and well-nourished. No distress.  HENT:  Head: Normocephalic and atraumatic.  Eyes: Conjunctivae  and EOM are normal.  Neck: Normal range of motion. Neck supple.  No meningeal signs  Cardiovascular: Normal rate, regular rhythm and normal heart sounds.  Exam reveals no gallop and no friction rub.   No murmur heard. Pulmonary/Chest: Effort normal and breath sounds normal. No respiratory distress. She has no wheezes. She has no rales. She exhibits no tenderness.  Abdominal: Soft. Bowel sounds are normal. She exhibits no distension. There is tenderness. There is no rebound and no guarding.  Mild  suprapubic tenderness on palpation. No pain at McBurney's point, no Rovsing's sign  Genitourinary:  No external lesions. Mucopurulent discharge in vaginal canal. No CMT or adnexal tenderness on exam. Chaperone present.   Musculoskeletal: Normal range of motion. She exhibits no edema and no tenderness.  FROM to upper and lower extremities No step-offs noted on C-spine No tenderness to palpation of the spinous processes of the C-spine, T-spine or L-spine Full range of motion of C-spine, T-spine or L-spine Mild tenderness to palpation of the paraspinous muscles   Neurological: She is alert and oriented to person, place, and time. No cranial nerve deficit.  Speech is clear and goal oriented, follows commands Sensation normal to light touch and two point discrimination Moves extremities without ataxia, coordination intact Normal gait and balance Normal strength in upper and lower extremities bilaterally including dorsiflexion and plantar flexion, strong and equal grip strength   Skin: Skin is warm and dry. She is not diaphoretic. No erythema.  3 cm linear laceration noted on posterior left forearm proximal to the wrist. Bleeding controlled. No pus. No erythema. No scabbing. No foreign bodies appreciated.   Psychiatric: She has a normal mood and affect.    ED Course  Procedures (including critical care time) LACERATION REPAIR Performed by: Glade Nurse Authorized by: Glade Nurse Consent: Verbal consent obtained. Risks and benefits: risks, benefits and alternatives were discussed Consent given by: patient Patient identity confirmed: provided demographic data Prepped and Draped in normal sterile fashion Wound explored  Laceration Location: posterior left forearm  Laceration Length: 3 cm  No Foreign Bodies seen or palpated  Anesthesia: local infiltration  Local anesthetic: lidocaine 2% without epinephrine  Anesthetic total: 4 ml  Irrigation method: syringe Amount of cleaning:  standard  Skin closure: 4-0 prolene  Number of sutures: 3  Technique: simple interrupted  Patient tolerance: Patient tolerated the procedure well with no immediate complications.  Labs Reviewed  WET PREP, GENITAL - Abnormal; Notable for the following:    Trich, Wet Prep MANY (*)    WBC, Wet Prep HPF POC MANY (*)    All other components within normal limits  URINALYSIS, ROUTINE W REFLEX MICROSCOPIC - Abnormal; Notable for the following:    APPearance TURBID (*)    Leukocytes, UA MODERATE (*)    All other components within normal limits  URINE MICROSCOPIC-ADD ON - Abnormal; Notable for the following:    Squamous Epithelial / LPF MANY (*)    Bacteria, UA FEW (*)    All other components within normal limits  GC/CHLAMYDIA PROBE AMP  URINE CULTURE  POCT PREGNANCY, URINE   No results found. Medications  TDaP (BOOSTRIX) injection 0.5 mL (0.5 mLs Intramuscular Given 05/27/13 0846)  azithromycin (ZITHROMAX) tablet 1,000 mg (1,000 mg Oral Given 05/27/13 1055)  cefTRIAXone (ROCEPHIN) injection 250 mg (250 mg Intramuscular Given 05/27/13 1056)  lidocaine (PF) (XYLOCAINE) 1 % injection 1 mL (1 mL Other Given 05/27/13 1056)  metroNIDAZOLE (FLAGYL) tablet 2,000 mg (2,000 mg Oral Given 05/27/13 1102)  1. Urinary tract infection   2. Trichomoniasis    Discharge Medication List as of 05/27/2013 11:14 AM    START taking these medications   Details  nitrofurantoin, macrocrystal-monohydrate, (MACROBID) 100 MG capsule Take 1 capsule (100 mg total) by mouth 2 (two) times daily., Starting 05/27/2013, Until Discontinued, Print    phenazopyridine (PYRIDIUM) 200 MG tablet Take 1 tablet (200 mg total) by mouth 3 (three) times daily., Starting 05/27/2013, Until Discontinued, Print        Filed Vitals:   05/27/13 0816 05/27/13 1117  BP: 120/68 115/67  Pulse: 87 79  Temp: 98.9 F (37.2 C) 97.9 F (36.6 C)  TempSrc: Oral Oral  Resp: 14 18  SpO2: 100% 100%     MDM  36 y.o. Female with PMHx  of trichomoniasis and PID presents today with similar sx of pelvic pain and malodorous discharge. Pelvic exam revealed mild cervical tenderness, more likely cervicitis than PID given that pt is afebrile, not toxic looking and pain was mild. Patient to be discharged with instructions to follow up with OBGYN. Pt understands GC/Chlamydia cultures pending and that they will need to inform all sexual partners within the last 6 months if results return positive. Pt has been treated prophylacticly with azithromycin and rocephin due to pts history, pelvic exam, and wet prep with increased WBCs. Also given 2 g Flagyl. Pt has been advised to not drink alcohol while on this medication. Will also treat for UTI given moderate leukocytes.   Pt advised that she will receive a call in 48 hours if the test is positive and to refrain from sexual activity for 48 hours. If the test is positive, pt is advised to refrain from sexual activity for 10 days for the medicine to take effect.  Pt not concerning for PID because hemodynamically stable and no cervical motion tenderness on pelvic exam. Pt has also been treated with flagyl for Bacterial Vaginosis.  Discussed that because pt has had recent unprotected sex, might want to consider getting tested for HIV as well. Counseled pt that latex condoms are the only way to prevent against STDs or HIV.Discussed reasons to seek immediate care.   Regarding laceration repair to left forearm, Tdap booster given. Wound cleaning complete with pressure irrigation, bottom of wound visualized, no foreign bodies appreciated. Laceration occurred < 8 hours prior to repair which was well tolerated. Pt has no co morbidities to effect normal wound healing. Discussed suture home care w pt and answered questions. Pt to f-u for wound check and suture removal in 7 days. Pt is hemodynamically stable w no complaints prior to dc.   Patient expresses understanding and agrees with plan.    Glade Nurse,  PA-C 05/27/13 1639

## 2013-05-27 NOTE — ED Notes (Signed)
Pt states L arm pain after accidentally dropping box cutter on arm. Also states brown colored vaginal discharge. States 7/10 L arm pain. Open wound noted. Pt is alert and oriented x4. No signs of distress noted.

## 2013-05-27 NOTE — ED Notes (Addendum)
PA at the bedside to perform pelvic exam. Pt tolerated without difficulty. Cultures sent to lab for testing.

## 2013-05-27 NOTE — ED Notes (Signed)
Pt also states brown colored discharge with foul odor. Denies abdominal pain. Denies urinary symptoms.

## 2013-05-28 LAB — URINE CULTURE: Colony Count: NO GROWTH

## 2013-05-28 LAB — GC/CHLAMYDIA PROBE AMP: GC Probe RNA: NEGATIVE

## 2013-05-29 NOTE — ED Provider Notes (Signed)
Medical screening examination/treatment/procedure(s) were performed by non-physician practitioner and as supervising physician I was immediately available for consultation/collaboration.   David H Yao, MD 05/29/13 1508 

## 2013-11-05 ENCOUNTER — Other Ambulatory Visit: Payer: Self-pay

## 2014-06-16 ENCOUNTER — Emergency Department (HOSPITAL_COMMUNITY)
Admission: EM | Admit: 2014-06-16 | Discharge: 2014-06-16 | Discharge status: Against Medical Advice | Payer: Self-pay | Attending: Emergency Medicine | Admitting: Emergency Medicine

## 2014-06-16 ENCOUNTER — Encounter (HOSPITAL_COMMUNITY): Payer: Self-pay | Admitting: Emergency Medicine

## 2014-06-16 DIAGNOSIS — N949 Unspecified condition associated with female genital organs and menstrual cycle: Secondary | ICD-10-CM | POA: Insufficient documentation

## 2014-06-16 NOTE — ED Notes (Signed)
Pt states pelvic pain that started this morning. Pt states vaginal discharge foul smell, as well as foul smelling period this past month.

## 2014-06-16 NOTE — ED Notes (Signed)
Pt states she is unable to wait any longer to be seen.  Encouraged pt to stay and she declined.

## 2014-06-17 ENCOUNTER — Inpatient Hospital Stay (HOSPITAL_COMMUNITY)
Admission: AD | Admit: 2014-06-17 | Discharge: 2014-06-17 | Disposition: A | Discharge status: Home / Self Care | Payer: Self-pay | Source: Ambulatory Visit | Attending: Obstetrics and Gynecology | Admitting: Obstetrics and Gynecology

## 2014-06-17 ENCOUNTER — Encounter (HOSPITAL_COMMUNITY): Payer: Self-pay | Admitting: *Deleted

## 2014-06-17 ENCOUNTER — Inpatient Hospital Stay (HOSPITAL_COMMUNITY): Payer: Self-pay

## 2014-06-17 DIAGNOSIS — R102 Pelvic and perineal pain: Secondary | ICD-10-CM

## 2014-06-17 DIAGNOSIS — D259 Leiomyoma of uterus, unspecified: Secondary | ICD-10-CM | POA: Insufficient documentation

## 2014-06-17 DIAGNOSIS — R109 Unspecified abdominal pain: Secondary | ICD-10-CM | POA: Insufficient documentation

## 2014-06-17 DIAGNOSIS — B9689 Other specified bacterial agents as the cause of diseases classified elsewhere: Secondary | ICD-10-CM | POA: Insufficient documentation

## 2014-06-17 DIAGNOSIS — A499 Bacterial infection, unspecified: Secondary | ICD-10-CM

## 2014-06-17 DIAGNOSIS — N760 Acute vaginitis: Secondary | ICD-10-CM | POA: Insufficient documentation

## 2014-06-17 DIAGNOSIS — M25559 Pain in unspecified hip: Secondary | ICD-10-CM | POA: Insufficient documentation

## 2014-06-17 HISTORY — DX: Leiomyoma of uterus, unspecified: D25.9

## 2014-06-17 LAB — URINALYSIS, ROUTINE W REFLEX MICROSCOPIC
BILIRUBIN URINE: NEGATIVE
Glucose, UA: NEGATIVE mg/dL
Hgb urine dipstick: NEGATIVE
Ketones, ur: NEGATIVE mg/dL
Leukocytes, UA: NEGATIVE
NITRITE: NEGATIVE
Protein, ur: NEGATIVE mg/dL
UROBILINOGEN UA: 0.2 mg/dL (ref 0.0–1.0)
pH: 5.5 (ref 5.0–8.0)

## 2014-06-17 LAB — WET PREP, GENITAL
Trich, Wet Prep: NONE SEEN
Yeast Wet Prep HPF POC: NONE SEEN

## 2014-06-17 LAB — POCT PREGNANCY, URINE: PREG TEST UR: NEGATIVE

## 2014-06-17 MED ORDER — METRONIDAZOLE 500 MG PO TABS: 500.0000 mg | ORAL_TABLET | Freq: Two times a day (BID) | ORAL | Status: DC

## 2014-06-17 MED ORDER — KETOROLAC TROMETHAMINE 10 MG PO TABS: 10.0000 mg | ORAL_TABLET | Freq: Four times a day (QID) | ORAL | Status: DC | PRN

## 2014-06-17 NOTE — MAU Provider Note (Signed)
Chief Complaint: Abdominal Pain   First Guerry Covington Initiated Contact with Patient 06/17/14 0119     SUBJECTIVE HPI: Dawn Lyons is a 38 y.o. G0P0 female who presents with low abdominal pain since 2200 last night and malodorous vaginal discharge. Rates pain 9/10 on pain scale. Describes it as continuous pressure. Hasn't taken any medicine tried any other comfort measures.   History similar pain, but only when she "holds her urine " or with periods many years ago. LMP 06/06/2014. Normal time, duration and flow. Known fibroids and endometriosis. Endometriosis pain minimal since laparoscopic fulguration 7 years ago.  Also complains of her urine stream being "weak", but thinks that she is able to empty her bladder fully.  Does not have gynecologist or primary care Demarie Uhlig  Past Medical History  Diagnosis Date  . Endometriosis   . PID (pelvic inflammatory disease)   . Trichimoniasis   . Fibroid uterus    OB History  Gravida Para Term Preterm AB SAB TAB Ectopic Multiple Living  0                Past Surgical History  Procedure Laterality Date  . Laparoscopic endometriosis fulguration     History   Social History  . Marital Status: Single    Spouse Name: N/A    Number of Children: N/A  . Years of Education: N/A   Occupational History  . Not on file.   Social History Main Topics  . Smoking status: Never Smoker   . Smokeless tobacco: Not on file  . Alcohol Use: No  . Drug Use: No  . Sexual Activity: Yes    Birth Control/ Protection: None   Other Topics Concern  . Not on file   Social History Narrative  . No narrative on file   No current facility-administered medications on file prior to encounter.   Current Outpatient Prescriptions on File Prior to Encounter  Medication Sig Dispense Refill  . naproxen sodium (ANAPROX) 220 MG tablet Take 440 mg by mouth daily as needed (headache).      . nitrofurantoin, macrocrystal-monohydrate, (MACROBID) 100 MG capsule Take 1  capsule (100 mg total) by mouth 2 (two) times daily.  10 capsule  0  . phenazopyridine (PYRIDIUM) 200 MG tablet Take 1 tablet (200 mg total) by mouth 3 (three) times daily.  6 tablet  0   No Known Allergies  ROS: Pertinent items in HPI  OBJECTIVE Temp:  [98.5 F (36.9 C)] 98.5 F (36.9 C) (06/17 2302) Pulse Rate:  [79] 79 (06/17 2302) Resp:  [16] 16 (06/17 2302) BP: (122)/(80) 122/80 mmHg (06/17 2302) SpO2:  [100 %] 100 % (06/17 2302) Weight:  [84.482 kg (186 lb 4 oz)] 84.482 kg (186 lb 4 oz) (06/17 2302)  GENERAL: Well-developed, well-nourished female in no apparent distress.  HEENT: Normocephalic HEART: normal rate RESP: normal effort ABDOMEN: Soft, mild to moderate suprapubic tenderness. Mass palpated from symphysis pubis to 3 fingerbreadths above. (Unsure if full bladder). Still present after voiding. EXTREMITIES: Nontender, no edema NEURO: Alert and oriented SPECULUM EXAM: NEFG, moderate amount of thin, white, malodorous discharge, no blood noted, cervix slightly friable. BIMANUAL: cervix close; uterus 8-10 week size,  positive left adnexal tenderness and? Mass versus palpating full bladder. no right adnexal tenderness or mass. No cervical motion tenderness.   LAB RESULTS Results for orders placed during the hospital encounter of 06/17/14 (from the past 24 hour(s))  URINALYSIS, ROUTINE W REFLEX MICROSCOPIC     Status: Abnormal   Collection  Time    06/17/14 12:30 AM      Result Value Ref Range   Color, Urine YELLOW  YELLOW   APPearance CLEAR  CLEAR   Specific Gravity, Urine >1.030 (*) 1.005 - 1.030   pH 5.5  5.0 - 8.0   Glucose, UA NEGATIVE  NEGATIVE mg/dL   Hgb urine dipstick NEGATIVE  NEGATIVE   Bilirubin Urine NEGATIVE  NEGATIVE   Ketones, ur NEGATIVE  NEGATIVE mg/dL   Protein, ur NEGATIVE  NEGATIVE mg/dL   Urobilinogen, UA 0.2  0.0 - 1.0 mg/dL   Nitrite NEGATIVE  NEGATIVE   Leukocytes, UA NEGATIVE  NEGATIVE  POCT PREGNANCY, URINE     Status: None   Collection  Time    06/17/14 12:45 AM      Result Value Ref Range   Preg Test, Ur NEGATIVE  NEGATIVE  WET PREP, GENITAL     Status: Abnormal   Collection Time    06/17/14  1:28 AM      Result Value Ref Range   Yeast Wet Prep HPF POC NONE SEEN  NONE SEEN   Trich, Wet Prep NONE SEEN  NONE SEEN   Clue Cells Wet Prep HPF POC FEW (*) NONE SEEN   WBC, Wet Prep HPF POC FEW (*) NONE SEEN    IMAGING US Transvaginal Non-ob  06/17/2014   CLINICAL DATA:  Lower abdominal pain.  Known fibroids.  EXAM: TRANSABDOMINAL AND TRANSVAGINAL ULTRASOUND OF PELVIS  TECHNIQUE: Both transabdominal and transvaginal ultrasound examinations of the pelvis were performed. Transabdominal technique was performed for global imaging of the pelvis including uterus, ovaries, adnexal regions, and pelvic cul-de-sac. It was necessary to proceed with endovaginal exam following the transabdominal exam to visualize the uterus and endometrium.  COMPARISON:  Pelvic ultrasound performed 03/30/2010  FINDINGS: Uterus  Measurements: 11.7 x 8.7 x 8.8 cm. Diffusely enlarged and heterogeneous in appearance, with multiple fibroids. The 3 largest fibroids are noted at the uterine fundus, measuring 5.1 x 5.1 x 4.9 cm, along the anterior left side of the uterus, measuring 4.7 x 4.1 x 4.0 cm, and along the lower uterine segment, measuring 4.7 x 3.8 x 4.2 cm.  Endometrium  Thickness: 0.8 cm. No focal abnormality visualized, though difficult to fully assess due to fibroids.  Right ovary  Measurements: 3.4 x 2.1 x 2.0 cm. Normal appearance/no adnexal mass.  Left ovary  Measurements: 2.0 x 1.6 x 1.6 cm. Normal appearance/no adnexal mass.  Other findings  No free fluid is seen within the pelvic cul-de-sac.  IMPRESSION: Uterus diffusely enlarged and heterogeneous in appearance, with multiple fibroids. Fibroids measure up to 5.1 cm in size. Otherwise unremarkable pelvic ultrasound. No evidence for ovarian torsion.   Electronically Signed   By: Garald Balding M.D.   On:  06/17/2014 04:15   US Pelvis Complete  06/17/2014   CLINICAL DATA:  Lower abdominal pain.  Known fibroids.  EXAM: TRANSABDOMINAL AND TRANSVAGINAL ULTRASOUND OF PELVIS  TECHNIQUE: Both transabdominal and transvaginal ultrasound examinations of the pelvis were performed. Transabdominal technique was performed for global imaging of the pelvis including uterus, ovaries, adnexal regions, and pelvic cul-de-sac. It was necessary to proceed with endovaginal exam following the transabdominal exam to visualize the uterus and endometrium.  COMPARISON:  Pelvic ultrasound performed 03/30/2010  FINDINGS: Uterus  Measurements: 11.7 x 8.7 x 8.8 cm. Diffusely enlarged and heterogeneous in appearance, with multiple fibroids. The 3 largest fibroids are noted at the uterine fundus, measuring 5.1 x 5.1 x 4.9 cm, along the anterior  left side of the uterus, measuring 4.7 x 4.1 x 4.0 cm, and along the lower uterine segment, measuring 4.7 x 3.8 x 4.2 cm.  Endometrium  Thickness: 0.8 cm. No focal abnormality visualized, though difficult to fully assess due to fibroids.  Right ovary  Measurements: 3.4 x 2.1 x 2.0 cm. Normal appearance/no adnexal mass.  Left ovary  Measurements: 2.0 x 1.6 x 1.6 cm. Normal appearance/no adnexal mass.  Other findings  No free fluid is seen within the pelvic cul-de-sac.  IMPRESSION: Uterus diffusely enlarged and heterogeneous in appearance, with multiple fibroids. Fibroids measure up to 5.1 cm in size. Otherwise unremarkable pelvic ultrasound. No evidence for ovarian torsion.   Electronically Signed   By: Garald Balding M.D.   On: 06/17/2014 04:15   MAU COURSE Bladder scanner average post-void residual of 175 ml 30-40 minutes after last void.   Pain improved spontaneously.  ASSESSMENT 1. BV (bacterial vaginosis)   2. Fibroid uterus   3. Acute pain in female pelvis     PLAN Discharge home in stable condition.  GC/CT, urine cultures pending.  Follow-up Information   Follow up with [facility]  [facility] [place]. (Routine gynecology care)    Contact information:   [address] [place] [address] [phone]      Follow up with [facility]. (Routine gynecology care)    Contact information:   [place] [address]       Follow up with [facility]. (OB/GYN emergencies)    Contact information:   [id number] [place] [address] [phone]      Follow up with [facility]. (As needed in emergencies)    Contact information:   [place] [id number]        Medication List    STOP taking these medications       nitrofurantoin (macrocrystal-monohydrate) 100 MG capsule  Commonly known as:  MACROBID     phenazopyridine 200 MG tablet  Commonly known as:  PYRIDIUM      TAKE these medications       ketorolac 10 MG tablet  Commonly known as:  TORADOL  Take 1 tablet (10 mg total) by mouth every 6 (six) hours as needed.     metroNIDAZOLE 500 MG tablet  Commonly known as:  FLAGYL  Take 1 tablet (500 mg total) by mouth 2 (two) times daily.     naproxen sodium 220 MG tablet  Commonly known as:  ANAPROX  Take 440 mg by mouth daily as needed (headache).       [name], CNM 06/17/2014  4:12 AM

## 2014-06-17 NOTE — MAU Note (Signed)
Pt states pain started about 2200 06/16/2014. Pt states she has had pain like this before associated with menses and during intercourse. Pt states she also feels it when she holds her urine.

## 2014-06-17 NOTE — Discharge Instructions (Signed)
Bacterial Vaginosis Bacterial vaginosis is a vaginal infection that occurs when the normal balance of bacteria in the vagina is disrupted. It results from an overgrowth of certain bacteria. This is the most common vaginal infection in women of childbearing age. Treatment is important to prevent complications, especially in pregnant women, as it can cause a premature delivery. CAUSES  Bacterial vaginosis is caused by an increase in harmful bacteria that are normally present in smaller amounts in the vagina. Several different kinds of bacteria can cause bacterial vaginosis. However, the reason that the condition develops is not fully understood. RISK FACTORS Certain activities or behaviors can put you at an increased risk of developing bacterial vaginosis, including:  Having a new sex partner or multiple sex partners.  Douching.  Using an intrauterine device (IUD) for contraception. Women do not get bacterial vaginosis from toilet seats, bedding, swimming pools, or contact with objects around them. SIGNS AND SYMPTOMS  Some women with bacterial vaginosis have no signs or symptoms. Common symptoms include:  Grey vaginal discharge.  A fishlike odor with discharge, especially after sexual intercourse.  Itching or burning of the vagina and vulva.  Burning or pain with urination. DIAGNOSIS  Your health care provider will take a medical history and examine the vagina for signs of bacterial vaginosis. A sample of vaginal fluid may be taken. Your health care provider will look at this sample under a microscope to check for bacteria and abnormal cells. A vaginal pH test may also be done.  TREATMENT  Bacterial vaginosis may be treated with antibiotic medicines. These may be given in the form of a pill or a vaginal cream. A second round of antibiotics may be prescribed if the condition comes back after treatment.  HOME CARE INSTRUCTIONS   Only take over-the-counter or prescription medicines as  directed by your health care provider.  If antibiotic medicine was prescribed, take it as directed. Make sure you finish it even if you start to feel better.  Do not have sex until treatment is completed.  Tell all sexual partners that you have a vaginal infection. They should see their health care provider and be treated if they have problems, such as a mild rash or itching.  Practice safe sex by using condoms and only having one sex partner. SEEK MEDICAL CARE IF:   Your symptoms are not improving after 3 days of treatment.  You have increased discharge or pain.  You have a fever. MAKE SURE YOU:   Understand these instructions.  Will watch your condition.  Will get help right away if you are not doing well or get worse. FOR MORE INFORMATION  Centers for Disease Control and Prevention, Division of STD Prevention: AppraiserFraud[employer]fi American Sexual Health Association (ASHA): www.ashastd.org  Document Released: 12/17/2005 Document Revised: 10/07/2013 Document Reviewed: 07/29/2013 [facility] Patient Information 2015 [place], [place]. This information is not intended to replace advice given to you by your health care provider. Make sure you discuss any questions you have with your health care provider.  Uterine Fibroid A uterine fibroid is a growth (tumor) that occurs in your uterus. This type of tumor is not cancerous and does not spread out of the uterus. You can have one or many fibroids. Fibroids can vary in size, weight, and where they grow in the uterus. Some can become quite large. Most fibroids do not require medical treatment, but some can cause pain or heavy bleeding during and between periods. CAUSES  A fibroid is the result of a single uterine  cell that keeps growing (unregulated), which is different than most cells in the human body. Most cells have a control mechanism that keeps them from reproducing without control.  SIGNS AND SYMPTOMS   Bleeding.  Pelvic pain and  pressure.  Bladder problems due to the size of the fibroid.  Infertility and miscarriages depending on the size and location of the fibroid. DIAGNOSIS  Uterine fibroids are diagnosed through a physical exam. Your health care provider may feel the lumpy tumors during a pelvic exam. Ultrasonography may be done to get information regarding size, location, and number of tumors.  TREATMENT   Your health care provider may recommend watchful waiting. This involves getting the fibroid checked by your health care provider to see if it grows or shrinks.   Hormone treatment or an intrauterine device (IUD) may be prescribed.   Surgery may be needed to remove the fibroids (myomectomy) or the uterus (hysterectomy). This depends on your situation. When fibroids interfere with fertility and a woman wants to become pregnant, a health care provider may recommend having the fibroids removed.  [place] care depends on how you were treated. In general:   Keep all follow-up appointments with your health care provider.   Only take over-the-counter or prescription medicines as directed by your health care provider. If you were prescribed a hormone treatment, take the hormone medicines exactly as directed. Do not take aspirin. It can cause bleeding.   Talk to your health care provider about taking iron pills.  If your periods are troublesome but not so heavy, lie down with your feet raised slightly above your heart. Place cold packs on your lower abdomen.   If your periods are heavy, write down the number of pads or tampons you use per month. Bring this information to your health care provider.   Include green vegetables in your diet.  SEEK IMMEDIATE MEDICAL CARE IF:  You have pelvic pain or cramps not controlled with medicines.   You have a sudden increase in pelvic pain.   You have an increase in bleeding between and during periods.   You have excessive periods and soak  tampons or pads in a half hour or less.  You feel lightheaded or have fainting episodes. Document Released: 12/14/2000 Document Revised: 10/07/2013 Document Reviewed: 07/16/2013 [facility] Patient Information 2015 [place], [place]. This information is not intended to replace advice given to you by your health care provider. Make sure you discuss any questions you have with your health care provider.

## 2014-06-18 LAB — URINE CULTURE
Colony Count: NO GROWTH
Culture: NO GROWTH
Special Requests: NORMAL

## 2014-06-19 LAB — GC/CHLAMYDIA PROBE AMP
CT Probe RNA: NEGATIVE
GC PROBE AMP APTIMA: NEGATIVE

## 2015-01-19 ENCOUNTER — Encounter (HOSPITAL_COMMUNITY): Payer: Self-pay

## 2015-01-19 ENCOUNTER — Inpatient Hospital Stay (HOSPITAL_COMMUNITY): Payer: BLUE CROSS/BLUE SHIELD

## 2015-01-19 ENCOUNTER — Inpatient Hospital Stay (HOSPITAL_COMMUNITY)
Admission: AD | Admit: 2015-01-19 | Discharge: 2015-01-19 | Disposition: A | Discharge status: Home / Self Care | Payer: BLUE CROSS/BLUE SHIELD | Source: Ambulatory Visit | Attending: Obstetrics and Gynecology | Admitting: Obstetrics and Gynecology

## 2015-01-19 DIAGNOSIS — N809 Endometriosis, unspecified: Secondary | ICD-10-CM | POA: Diagnosis not present

## 2015-01-19 DIAGNOSIS — B9689 Other specified bacterial agents as the cause of diseases classified elsewhere: Secondary | ICD-10-CM | POA: Diagnosis not present

## 2015-01-19 DIAGNOSIS — R102 Pelvic and perineal pain: Secondary | ICD-10-CM | POA: Diagnosis present

## 2015-01-19 DIAGNOSIS — N390 Urinary tract infection, site not specified: Secondary | ICD-10-CM | POA: Insufficient documentation

## 2015-01-19 DIAGNOSIS — D259 Leiomyoma of uterus, unspecified: Secondary | ICD-10-CM | POA: Diagnosis not present

## 2015-01-19 LAB — URINE MICROSCOPIC-ADD ON

## 2015-01-19 LAB — URINALYSIS, ROUTINE W REFLEX MICROSCOPIC
Bilirubin Urine: NEGATIVE
Glucose, UA: NEGATIVE mg/dL
Ketones, ur: NEGATIVE mg/dL
Leukocytes, UA: NEGATIVE
NITRITE: NEGATIVE
Protein, ur: NEGATIVE mg/dL
UROBILINOGEN UA: 0.2 mg/dL (ref 0.0–1.0)
pH: 5.5 (ref 5.0–8.0)

## 2015-01-19 LAB — WET PREP, GENITAL
CLUE CELLS WET PREP: NONE SEEN
Trich, Wet Prep: NONE SEEN
Yeast Wet Prep HPF POC: NONE SEEN

## 2015-01-19 LAB — GC/CHLAMYDIA PROBE AMP (~~LOC~~) NOT AT ARMC
Chlamydia: NEGATIVE
Neisseria Gonorrhea: NEGATIVE

## 2015-01-19 LAB — POCT PREGNANCY, URINE: Preg Test, Ur: NEGATIVE

## 2015-01-19 MED ORDER — KETOROLAC TROMETHAMINE 60 MG/2ML IM SOLN
60.0000 mg | Freq: Once | INTRAMUSCULAR | Status: AC
  Administered 2015-01-19: 60 mg via INTRAMUSCULAR
  Filled 2015-01-19: qty 2

## 2015-01-19 MED ORDER — CIPROFLOXACIN HCL 250 MG PO TABS: 250.0000 mg | ORAL_TABLET | Freq: Two times a day (BID) | ORAL | Status: DC

## 2015-01-19 NOTE — Discharge Instructions (Signed)
Urinary Tract Infection A urinary tract infection (UTI) can occur any place along the urinary tract. The tract includes the kidneys, ureters, bladder, and urethra. A type of germ called bacteria often causes a UTI. UTIs are often helped with antibiotic medicine.  HOME CARE   If given, take antibiotics as told by your doctor. Finish them even if you start to feel better.  Drink enough fluids to keep your pee (urine) clear or pale yellow.  Avoid tea, drinks with caffeine, and bubbly (carbonated) drinks.  Pee often. Avoid holding your pee in for a long time.  Pee before and after having sex (intercourse).  Wipe from front to back after you poop (bowel movement) if you are a woman. Use each tissue only once. GET HELP RIGHT AWAY IF:   You have back pain.  You have lower belly (abdominal) pain.  You have chills.  You feel sick to your stomach (nauseous).  You throw up (vomit).  Your burning or discomfort with peeing does not go away.  You have a fever.  Your symptoms are not better in 3 days. MAKE SURE YOU:   Understand these instructions.  Will watch your condition.  Will get help right away if you are not doing well or get worse. Document Released: 06/04/2008 Document Revised: 09/10/2012 Document Reviewed: 07/17/2012 [facility] Patient Information 2015 [place], [place]. This information is not intended to replace advice given to you by your health care provider. Make sure you discuss any questions you have with your health care provider. Fibroids Fibroids are lumps (tumors) that can occur any place in a woman's body. These lumps are not cancerous. Fibroids vary in size, weight, and where they grow. HOME CARE  Do not take aspirin.  Write down the number of pads or tampons you use during your period. Tell your doctor. This can help determine the best treatment for you. GET HELP RIGHT AWAY IF:  You have pain in your lower belly (abdomen) that is not helped with  medicine.  You have cramps that are not helped with medicine.  You have more bleeding between or during your period.  You feel lightheaded or pass out (faint).  Your lower belly pain gets worse. MAKE SURE YOU:  Understand these instructions.  Will watch your condition.  Will get help right away if you are not doing well or get worse. Document Released: 01/19/2011 Document Revised: 03/10/2012 Document Reviewed: 01/19/2011 [facility] Patient Information 2015 [place], [place]. This information is not intended to replace advice given to you by your health care provider. Make sure you discuss any questions you have with your health care provider.

## 2015-01-19 NOTE — MAU Provider Note (Signed)
History     CSN: [id number]  Arrival date and time: 01/19/15 4492   First Provider Initiated Contact with Patient 01/19/15 [phone]      Chief Complaint  Patient presents with  . Abdominal Pain   HPI  Ms. Dawn Lyons is a 39 y.o. G0P0 who presents to MAU today with complaint of pelvic pain. She states pain is worse with urination. She states LMP 01/11/15 was normal. She has continued to note brown discharge. She denies fever. She states pain is lower abdominal at midline. She has also noted urinary frequency and urgency. She is sexually active and does not use protection. She states recently noted dyspareunia also. Patient has a history of endometriosis and multiple fibroids. She denies any GYN care since last visit in MAU 05/2014.   OB History    Gravida Para Term Preterm AB TAB SAB Ectopic Multiple Living   0               Past Medical History  Diagnosis Date  . Endometriosis   . PID (pelvic inflammatory disease)   . Trichimoniasis   . Fibroid uterus     Past Surgical History  Procedure Laterality Date  . Laparoscopic endometriosis fulguration      Family History  Problem Relation Age of Onset  . Hypertension Mother   . Hypertension Sister   . Diabetes Maternal Grandmother     History  Substance Use Topics  . Smoking status: Never Smoker   . Smokeless tobacco: Not on file  . Alcohol Use: No    Allergies: No Known Allergies  Prescriptions prior to admission  Medication Sig Dispense Refill Last Dose  . ketorolac (TORADOL) 10 MG tablet Take 1 tablet (10 mg total) by mouth every 6 (six) hours as needed. 20 tablet 0   . metroNIDAZOLE (FLAGYL) 500 MG tablet Take 1 tablet (500 mg total) by mouth 2 (two) times daily. 14 tablet 0   . naproxen sodium (ANAPROX) 220 MG tablet Take 440 mg by mouth daily as needed (headache).   Past Week at Unknown    Review of Systems  Constitutional: Negative for fever and malaise/fatigue.  Gastrointestinal: Positive for abdominal  pain. Negative for nausea, vomiting, diarrhea and constipation.  Genitourinary: Positive for urgency and frequency. Negative for dysuria, hematuria and flank pain.       Neg - vaginal discharge + vaginal bleeding   Physical Exam   Blood pressure 110/77, pulse 93, temperature 98.1 F (36.7 C), temperature source Oral, resp. rate 16, height 5\' 2"  (1.575 m), weight 187 lb (84.823 kg), last menstrual period 01/11/2015, SpO2 100 %.  Physical Exam  Constitutional: She is oriented to person, place, and time. She appears well-developed and well-nourished. No distress.  HENT:  Head: Normocephalic.  Cardiovascular: Normal rate.   Respiratory: Effort normal.  GI: Soft. She exhibits no distension and no mass. There is tenderness (mild tenderness to palpation). There is no rebound and no guarding.  Genitourinary: Uterus is enlarged and tender (mild). Cervix exhibits no motion tenderness, no discharge and no friability. Right adnexum displays no mass and no tenderness. Left adnexum displays tenderness (mild). Left adnexum displays no mass. No bleeding in the vagina. Vaginal discharge (moderate amount of thin, watery, light brown discharge) found.  Neurological: She is alert and oriented to person, place, and time.  Skin: Skin is warm and dry. No erythema.  Psychiatric: She has a normal mood and affect.   Results for orders placed or performed during the  hospital encounter of 01/19/15 (from the past 24 hour(s))  Urinalysis, Routine w reflex microscopic     Status: Abnormal   Collection Time: 01/19/15  2:46 AM  Result Value Ref Range   Color, Urine YELLOW YELLOW   APPearance CLEAR CLEAR   Specific Gravity, Urine >1.030 (H) 1.005 - 1.030   pH 5.5 5.0 - 8.0   Glucose, UA NEGATIVE NEGATIVE mg/dL   Hgb urine dipstick SMALL (A) NEGATIVE   Bilirubin Urine NEGATIVE NEGATIVE   Ketones, ur NEGATIVE NEGATIVE mg/dL   Protein, ur NEGATIVE NEGATIVE mg/dL   Urobilinogen, UA 0.2 0.0 - 1.0 mg/dL   Nitrite  NEGATIVE NEGATIVE   Leukocytes, UA NEGATIVE NEGATIVE  Urine microscopic-add on     Status: Abnormal   Collection Time: 01/19/15  2:46 AM  Result Value Ref Range   Squamous Epithelial / LPF FEW (A) RARE   WBC, UA 0-2 <3 WBC/hpf   RBC / HPF 0-2 <3 RBC/hpf   Bacteria, UA RARE RARE  Pregnancy, urine POC     Status: None   Collection Time: 01/19/15  2:57 AM  Result Value Ref Range   Preg Test, Ur NEGATIVE NEGATIVE  Wet prep, genital     Status: Abnormal   Collection Time: 01/19/15  3:27 AM  Result Value Ref Range   Yeast Wet Prep HPF POC NONE SEEN NONE SEEN   Trich, Wet Prep NONE SEEN NONE SEEN   Clue Cells Wet Prep HPF POC NONE SEEN NONE SEEN   WBC, Wet Prep HPF POC FEW (A) NONE SEEN   US Transvaginal Non-ob  01/19/2015   CLINICAL DATA:  Pelvic pain in a female 06/17/2014  EXAM: TRANSABDOMINAL AND TRANSVAGINAL ULTRASOUND OF PELVIS  TECHNIQUE: Both transabdominal and transvaginal ultrasound examinations of the pelvis were performed. Transabdominal technique was performed for global imaging of the pelvis including uterus, ovaries, adnexal regions, and pelvic cul-de-sac. It was necessary to proceed with endovaginal exam following the transabdominal exam to visualize the endometrium and ovaries.  COMPARISON:  None  FINDINGS: Uterus  Measurements: 12 x 8 x 8 cm. The uterus is distorted by multiple heterogeneous, partly shadowing masses consistent with fibroids. These extend from the serosal to mucosal surfaces with endometrial distortion. Three measured fibroids:  *4.8 cm in the right uterine body *5.4 cm in the uterine fundus *5.6 cm in the left cornual region.  Endometrium  Thickness: 9 mm where visible.  No focal abnormality visualized.  Right ovary  Measurements: 3.2 x 1.9 x 2.3 cm. Normal appearance/no adnexal mass.  Left ovary  Measurements: 3 x 2.2 x 2 cm. Normal appearance/no adnexal mass.  Other findings  No free fluid.  IMPRESSION: 1. Stable pelvic ultrasound. 2. Multiple uterine fibroids, up  to 5.6 cm diameter. The fibroids distort the endometrium.   Electronically Signed   By: Jorje Guild M.D.   On: 01/19/2015 04:10   US Pelvis Complete  01/19/2015   CLINICAL DATA:  Pelvic pain in a female 06/17/2014  EXAM: TRANSABDOMINAL AND TRANSVAGINAL ULTRASOUND OF PELVIS  TECHNIQUE: Both transabdominal and transvaginal ultrasound examinations of the pelvis were performed. Transabdominal technique was performed for global imaging of the pelvis including uterus, ovaries, adnexal regions, and pelvic cul-de-sac. It was necessary to proceed with endovaginal exam following the transabdominal exam to visualize the endometrium and ovaries.  COMPARISON:  None  FINDINGS: Uterus  Measurements: 12 x 8 x 8 cm. The uterus is distorted by multiple heterogeneous, partly shadowing masses consistent with fibroids. These extend from the serosal  to mucosal surfaces with endometrial distortion. Three measured fibroids:  *4.8 cm in the right uterine body *5.4 cm in the uterine fundus *5.6 cm in the left cornual region.  Endometrium  Thickness: 9 mm where visible.  No focal abnormality visualized.  Right ovary  Measurements: 3.2 x 1.9 x 2.3 cm. Normal appearance/no adnexal mass.  Left ovary  Measurements: 3 x 2.2 x 2 cm. Normal appearance/no adnexal mass.  Other findings  No free fluid.  IMPRESSION: 1. Stable pelvic ultrasound. 2. Multiple uterine fibroids, up to 5.6 cm diameter. The fibroids distort the endometrium.   Electronically Signed   By: Jorje Guild M.D.   On: 01/19/2015 04:10    MAU Course  Procedures None  MDM UA, wet prep, GC/Chlamydia, HIV, RPR and [place] today Urine culture pending Assessment and Plan  A: Fibroid uterus Endometriosis Pelvic pain in female UTI  P: Discharge home Rx for Cipro given to patient Advised Ibuprofen PRN for pain Patient referred to [facility] for further management of fibroids and pelvic pain Patient may return to MAU as needed or if her condition were to change or worsen    Luvenia Redden, PA-C  01/19/2015, 4:35 AM

## 2015-01-19 NOTE — MAU Note (Signed)
Painful to have sex and lower abdominal pain since Wednesday night.  Since period ended Monday night, having brown discharge.  Urinating more often, comes out slow then comes out strong.  Since period, had a loose bowel movement, once every other day.  No bleeding.

## 2015-01-20 LAB — RPR: RPR Ser Ql: NONREACTIVE

## 2015-01-20 LAB — HIV ANTIBODY (ROUTINE TESTING W REFLEX)
HIV 1/O/2 Abs-Index Value: <1 (ref <1.00)
HIV-1/HIV-2 Ab: NONREACTIVE

## 2015-01-21 LAB — URINE CULTURE

## 2015-01-25 ENCOUNTER — Encounter: Payer: Self-pay | Admitting: Obstetrics & Gynecology

## 2015-02-18 ENCOUNTER — Encounter: Payer: BLUE CROSS/BLUE SHIELD | Admitting: Family Medicine

## 2015-04-09 ENCOUNTER — Emergency Department (HOSPITAL_COMMUNITY)
Admission: EM | Admit: 2015-04-09 | Discharge: 2015-04-09 | Disposition: A | Discharge status: Home / Self Care | Payer: BLUE CROSS/BLUE SHIELD | Source: Home / Self Care | Attending: Family Medicine | Admitting: Family Medicine

## 2015-04-09 ENCOUNTER — Encounter (HOSPITAL_COMMUNITY): Payer: Self-pay | Admitting: Emergency Medicine

## 2015-04-09 DIAGNOSIS — L729 Follicular cyst of the skin and subcutaneous tissue, unspecified: Secondary | ICD-10-CM | POA: Diagnosis not present

## 2015-04-09 DIAGNOSIS — L089 Local infection of the skin and subcutaneous tissue, unspecified: Secondary | ICD-10-CM

## 2015-04-09 DIAGNOSIS — L0889 Other specified local infections of the skin and subcutaneous tissue: Secondary | ICD-10-CM | POA: Diagnosis not present

## 2015-04-09 MED ORDER — MUPIROCIN CALCIUM 2 % EX CREA: 1.0000 "application " | TOPICAL_CREAM | Freq: Two times a day (BID) | CUTANEOUS | Status: DC

## 2015-04-09 MED ORDER — DOXYCYCLINE HYCLATE 100 MG PO CAPS: 100.0000 mg | ORAL_CAPSULE | Freq: Two times a day (BID) | ORAL | Status: DC

## 2015-04-09 NOTE — ED Provider Notes (Signed)
CSN: [id number]     Arrival date & time 04/09/15  0919 History   First MD Initiated Contact with Patient 04/09/15 1007     Chief Complaint  Patient presents with  . Groin Swelling    reports bumps on vagina   (Consider location/radiation/quality/duration/timing/severity/associated sxs/prior Treatment) HPI Comments: 39 year old female is complaining of pustules to the anterior pelvis/suprapubic area. She states these occur periodically often associated with her menses. Frequently they calmed after her flow. A for few days then resolved. She describes him as pustules that will eventually break and just ooze away.   Past Medical History  Diagnosis Date  . Endometriosis   . PID (pelvic inflammatory disease)   . Trichimoniasis   . Fibroid uterus    Past Surgical History  Procedure Laterality Date  . Laparoscopic endometriosis fulguration     Family History  Problem Relation Age of Onset  . Hypertension Mother   . Hypertension Sister   . Diabetes Maternal Grandmother    History  Substance Use Topics  . Smoking status: Never Smoker   . Smokeless tobacco: Not on file  . Alcohol Use: No   OB History    Gravida Para Term Preterm AB TAB SAB Ectopic Multiple Living   0              Review of Systems  Constitutional: Negative.   Genitourinary: Positive for genital sores. Negative for dysuria, vaginal discharge, vaginal pain and pelvic pain.  Skin:       As per history of present illness  All other systems reviewed and are negative.   Allergies  Review of patient's allergies indicates no known allergies.  Home Medications   Prior to Admission medications   Medication Sig Start Date End Date Taking? Authorizing Provider  ciprofloxacin (CIPRO) 250 MG tablet Take 1 tablet (250 mg total) by mouth every 12 (twelve) hours. 01/19/15   Luvenia Redden, PA-C  doxycycline (VIBRAMYCIN) 100 MG capsule Take 1 capsule (100 mg total) by mouth 2 (two) times daily. 04/09/15   Janne Napoleon, NP   mupirocin cream (BACTROBAN) 2 % Apply 1 application topically 2 (two) times daily. 04/09/15   Janne Napoleon, NP  naproxen sodium (ANAPROX) 220 MG tablet Take 440 mg by mouth daily as needed (headache).    Historical Provider, MD   BP 127/85 mmHg  Pulse 83  Temp(Src) 97.9 F (36.6 C) (Oral)  Resp 16  SpO2 100%  LMP 03/27/2015 Physical Exam  Constitutional: She is oriented to person, place, and time. She appears well-developed and well-nourished. No distress.  Pulmonary/Chest: Effort normal. No respiratory distress.  Musculoskeletal: Normal range of motion. She exhibits no edema.  Neurological: She is alert and oriented to person, place, and time.  Skin: Skin is warm and dry.  There are 2 adjacent pustular and cystic like lesions within the right pelvis but well separated from the genitalia. The lesions are firm and round and above the surface of the skin. No associated erythema or lymphangitis.  Psychiatric: She has a normal mood and affect.  Nursing note and vitals reviewed.   ED Course  Procedures (including critical care time) Labs Review Labs Reviewed  CULTURE, ROUTINE-ABSCESS    Imaging Review No results found.   MDM   1. Infected cyst of skin   2. Skin pustule    the 2 lesions described in physical exam above were compressed with Q-tips and purulence was suppressed and a culture sample was obtained. Patient is instructed to apply warm  compresses keep area clean and dry with soap water and use alcohol after removal. She is treated as below. No external genital lesions are observed.  Keep areas subject to pustules cleaned with alcohol and soap and water frequently Mupirocin cream to current areas Doxycycline twice a day Culture of pustule pending Follow-up your primary care doctor.    Janne Napoleon, NP 04/09/15 1057

## 2015-04-09 NOTE — Discharge Instructions (Signed)
Epidermal Cyst An epidermal cyst is usually a small, painless lump under the skin. Cysts often occur on the face, neck, stomach, chest, or genitals. The cyst may be filled with a bad smelling paste. Do not pop your cyst. Popping the cyst can cause pain and puffiness (swelling). HOME CARE   Only take medicines as told by your doctor.  Take your medicine (antibiotics) as told. Finish it even if you start to feel better. GET HELP RIGHT AWAY IF:  Your cyst is tender, red, or puffy.  You are not getting better, or you are getting worse.  You have any questions or concerns. MAKE SURE YOU:  Understand these instructions.  Will watch your condition.  Will get help right away if you are not doing well or get worse. Document Released: 01/24/2005 Document Revised: 06/17/2012 Document Reviewed: 06/25/2011 [employer] Patient Information 2015 [employer]. This information is not intended to replace advice given to you by your health care provider. Make sure you discuss any questions you have with your health care provider.  

## 2015-04-09 NOTE — ED Notes (Signed)
C/o vaginal bumps that retain pus and bleed from time to time.   Denies any pelvic or abdominal pain.  Symptoms present since the end of  March.    No otc treatments tried.

## 2015-04-12 LAB — CULTURE, ROUTINE-ABSCESS: Gram Stain: NONE SEEN

## 2015-04-15 ENCOUNTER — Telehealth (HOSPITAL_COMMUNITY): Payer: Self-pay | Admitting: *Deleted

## 2015-04-15 NOTE — ED Notes (Addendum)
Abscess culture vaginal area: Few MRSA.  Pt. adequately treated with Doxycycline.  I called pt. and left a message to call. Call 1. Dawn Lyons 04/15/2015 Left message. Call 2. 04/19/2015 Pt. called back.  Pt. verified x 2 and given result. Pt. told she was adequately treated with the Doxycycline and the Bactroban. Insructed to finish all of the medication.  I reviewed the [facility] MRSA instructions with her and she voiced understanding. [name]

## 2015-05-07 ENCOUNTER — Encounter (HOSPITAL_COMMUNITY): Payer: Self-pay | Admitting: Emergency Medicine

## 2015-05-07 ENCOUNTER — Emergency Department (HOSPITAL_COMMUNITY)
Admission: EM | Admit: 2015-05-07 | Discharge: 2015-05-07 | Disposition: A | Discharge status: Home / Self Care | Payer: BLUE CROSS/BLUE SHIELD | Source: Home / Self Care

## 2015-05-07 DIAGNOSIS — G44219 Episodic tension-type headache, not intractable: Secondary | ICD-10-CM | POA: Diagnosis not present

## 2015-05-07 MED ORDER — IBUPROFEN 800 MG PO TABS: 800.0000 mg | ORAL_TABLET | Freq: Three times a day (TID) | ORAL | Status: DC

## 2015-05-07 MED ORDER — KETOROLAC TROMETHAMINE 60 MG/2ML IM SOLN
60.0000 mg | Freq: Once | INTRAMUSCULAR | Status: AC
  Administered 2015-05-07: 60 mg via INTRAMUSCULAR

## 2015-05-07 MED ORDER — ACETAMINOPHEN 325 MG PO TABS
ORAL_TABLET | ORAL | Status: AC
  Filled 2015-05-07: qty 3

## 2015-05-07 MED ORDER — KETOROLAC TROMETHAMINE 60 MG/2ML IM SOLN
INTRAMUSCULAR | Status: AC
  Filled 2015-05-07: qty 2

## 2015-05-07 MED ORDER — ACETAMINOPHEN 325 MG PO TABS
975.0000 mg | ORAL_TABLET | Freq: Once | ORAL | Status: AC
  Administered 2015-05-07: 975 mg via ORAL

## 2015-05-07 NOTE — ED Notes (Signed)
Pt comes in with c/o headache that started this morning  Noted in r eye radiating to frontal area Denies n/v or dizziness,blurry vision  OTC Alleve not effective for pain  VSS

## 2015-05-07 NOTE — ED Provider Notes (Signed)
CSN: [id number]     Arrival date & time 05/07/15  1837 History   None    Chief Complaint  Patient presents with  . Headache   Patient is a 39 y.o. female presenting with headaches.  Headache Pain location:  Frontal Quality:  Sharp Radiates to:  Does not radiate Severity currently:  8/10 Onset quality:  Sudden Duration:  12 hours Timing:  Constant Progression:  Waxing and waning Chronicity:  New Similar to prior headaches: yes   Context: not exposure to bright light and not coughing   Relieved by:  NSAIDs Worsened by:  Nothing Associated symptoms: no dizziness, no eye pain, no fever, no focal weakness, no nausea, no near-syncope, no neck pain, no numbness, no photophobia, no sinus pressure, no sore throat and no weakness     Past Medical History  Diagnosis Date  . Endometriosis   . PID (pelvic inflammatory disease)   . Trichimoniasis   . Fibroid uterus    Past Surgical History  Procedure Laterality Date  . Laparoscopic endometriosis fulguration     Family History  Problem Relation Age of Onset  . Hypertension Mother   . Hypertension Sister   . Diabetes Maternal Grandmother    History  Substance Use Topics  . Smoking status: Never Smoker   . Smokeless tobacco: Not on file  . Alcohol Use: No   OB History    Gravida Para Term Preterm AB TAB SAB Ectopic Multiple Living   0              Review of Systems  Constitutional: Negative for fever, chills and activity change.  HENT: Negative for sinus pressure and sore throat.   Eyes: Negative for photophobia and pain.  Cardiovascular: Negative for near-syncope.  Gastrointestinal: Negative for nausea.  Musculoskeletal: Negative for neck pain.  Allergic/Immunologic: Negative for environmental allergies.  Neurological: Positive for headaches. Negative for dizziness, focal weakness, speech difficulty, weakness, light-headedness and numbness.  Psychiatric/Behavioral: Negative for agitation.    Allergies  Review of  patient's allergies indicates no known allergies.  Home Medications   Prior to Admission medications   Medication Sig Start Date End Date Taking? Authorizing Provider  ciprofloxacin (CIPRO) 250 MG tablet Take 1 tablet (250 mg total) by mouth every 12 (twelve) hours. 01/19/15   Luvenia Redden, PA-C  doxycycline (VIBRAMYCIN) 100 MG capsule Take 1 capsule (100 mg total) by mouth 2 (two) times daily. 04/09/15   Janne Napoleon, NP  ibuprofen (ADVIL,MOTRIN) 800 MG tablet Take 1 tablet (800 mg total) by mouth 3 (three) times daily. 05/07/15   Tereasa Coop, PA-C  mupirocin cream (BACTROBAN) 2 % Apply 1 application topically 2 (two) times daily. 04/09/15   Janne Napoleon, NP  naproxen sodium (ANAPROX) 220 MG tablet Take 440 mg by mouth daily as needed (headache).    Historical Provider, MD   BP 110/79 mmHg  Pulse 71  Temp(Src) 98.1 F (36.7 C) (Oral)  SpO2 100%  LMP 04/22/2015 Physical Exam  Constitutional: She is oriented to person, place, and time. She appears well-developed and well-nourished. No distress.  Neck: Normal range of motion.  Cardiovascular: Normal rate.   Pulmonary/Chest: Effort normal.  Abdominal: Soft.  Musculoskeletal: Normal range of motion.  Neurological: She is alert and oriented to person, place, and time. She has normal reflexes. She is not disoriented. She displays no atrophy, no tremor and normal reflexes. No cranial nerve deficit. She exhibits normal muscle tone. She displays no seizure activity. Coordination and gait normal.  GCS eye subscore is 4. GCS verbal subscore is 5. GCS motor subscore is 6.  Heel to toe walking intact.   Skin: Skin is warm and dry. She is not diaphoretic.  Psychiatric: She has a normal mood and affect. Her behavior is normal. Judgment and thought content normal.  Nursing note and vitals reviewed.   ED Course  Procedures (including critical care time) Labs Review Labs Reviewed - No data to display  Imaging Review No results found.   MDM   1.  Episodic tension-type headache, not intractable    Patient here with symptoms consistent with a tension type HA. Exam reassuring.  Will treat with 975 tylenol and 60 IM Toradol.  Rx for 800 ibuprofen for future episodes.  Advised that she stay hydrated.     Tereasa Coop, PA-C 05/07/15 [phone]

## 2015-06-04 ENCOUNTER — Encounter (HOSPITAL_COMMUNITY): Payer: Self-pay | Admitting: Emergency Medicine

## 2015-06-04 ENCOUNTER — Emergency Department (HOSPITAL_COMMUNITY)
Admission: EM | Admit: 2015-06-04 | Discharge: 2015-06-04 | Discharge status: Against Medical Advice | Payer: BLUE CROSS/BLUE SHIELD | Attending: Emergency Medicine | Admitting: Emergency Medicine

## 2015-06-04 ENCOUNTER — Emergency Department (HOSPITAL_COMMUNITY)
Admission: EM | Admit: 2015-06-04 | Discharge: 2015-06-04 | Disposition: A | Discharge status: Home / Self Care | Payer: BLUE CROSS/BLUE SHIELD | Source: Home / Self Care | Attending: Family Medicine | Admitting: Family Medicine

## 2015-06-04 DIAGNOSIS — R1030 Lower abdominal pain, unspecified: Secondary | ICD-10-CM | POA: Insufficient documentation

## 2015-06-04 DIAGNOSIS — R1031 Right lower quadrant pain: Secondary | ICD-10-CM | POA: Diagnosis not present

## 2015-06-04 LAB — POCT URINALYSIS DIP (DEVICE)
Bilirubin Urine: NEGATIVE
Glucose, UA: NEGATIVE mg/dL
Ketones, ur: NEGATIVE mg/dL
Nitrite: NEGATIVE
Protein, ur: NEGATIVE mg/dL
Specific Gravity, Urine: >=1.03 (ref 1.005–1.030)
Urobilinogen, UA: 0.2 mg/dL (ref 0.0–1.0)
pH: 5.5 (ref 5.0–8.0)

## 2015-06-04 LAB — CBC WITH DIFFERENTIAL/PLATELET
BASOS ABS: 0 10*3/uL (ref 0.0–0.1)
Basophils Relative: 0 % (ref 0–1)
EOS PCT: 1 % (ref 0–5)
Eosinophils Absolute: 0 10*3/uL (ref 0.0–0.7)
HCT: 35.4 % — ABNORMAL LOW (ref 36.0–46.0)
Hemoglobin: 11.8 g/dL — ABNORMAL LOW (ref 12.0–15.0)
LYMPHS ABS: 1.7 10*3/uL (ref 0.7–4.0)
Lymphocytes Relative: 30 % (ref 12–46)
MCH: 26.3 pg (ref 26.0–34.0)
MCHC: 33.3 g/dL (ref 30.0–36.0)
MCV: 78.8 fL (ref 78.0–100.0)
MONO ABS: 0.2 10*3/uL (ref 0.1–1.0)
Monocytes Relative: 4 % (ref 3–12)
Neutro Abs: 3.6 10*3/uL (ref 1.7–7.7)
Neutrophils Relative %: 65 % (ref 43–77)
Platelets: 339 10*3/uL (ref 150–400)
RBC: 4.49 MIL/uL (ref 3.87–5.11)
RDW: 15.6 % — AB (ref 11.5–15.5)
WBC: 5.5 10*3/uL (ref 4.0–10.5)

## 2015-06-04 LAB — COMPREHENSIVE METABOLIC PANEL
ALBUMIN: 3.6 g/dL (ref 3.5–5.0)
ALT: 10 U/L — AB (ref 14–54)
ANION GAP: 8 (ref 5–15)
AST: 16 U/L (ref 15–41)
Alkaline Phosphatase: 72 U/L (ref 38–126)
BUN: 9 mg/dL (ref 6–20)
CALCIUM: 8.8 mg/dL — AB (ref 8.9–10.3)
CHLORIDE: 104 mmol/L (ref 101–111)
CO2: 23 mmol/L (ref 22–32)
CREATININE: 0.91 mg/dL (ref 0.44–1.00)
GFR calc Af Amer: >60 mL/min (ref >60)
Glucose, Bld: 101 mg/dL — ABNORMAL HIGH (ref 65–99)
Potassium: 4.3 mmol/L (ref 3.5–5.1)
Sodium: 135 mmol/L (ref 135–145)
Total Bilirubin: 0.4 mg/dL (ref 0.3–1.2)
Total Protein: 7.5 g/dL (ref 6.5–8.1)

## 2015-06-04 LAB — URINE MICROSCOPIC-ADD ON

## 2015-06-04 LAB — URINALYSIS, ROUTINE W REFLEX MICROSCOPIC
Bilirubin Urine: NEGATIVE
Glucose, UA: NEGATIVE mg/dL
HGB URINE DIPSTICK: NEGATIVE
KETONES UR: NEGATIVE mg/dL
LEUKOCYTES UA: NEGATIVE
Nitrite: NEGATIVE
PH: 5.5 (ref 5.0–8.0)
Protein, ur: NEGATIVE mg/dL
Specific Gravity, Urine: 1.031 — ABNORMAL HIGH (ref 1.005–1.030)
UROBILINOGEN UA: 0.2 mg/dL (ref 0.0–1.0)

## 2015-06-04 LAB — PREGNANCY, URINE: PREG TEST UR: NEGATIVE

## 2015-06-04 LAB — POCT PREGNANCY, URINE: Preg Test, Ur: NEGATIVE

## 2015-06-04 NOTE — ED Notes (Signed)
C/o abd pain onset 1700 today Sx include diarrhea and feeling nauseas Denies vomiting, fevers, chills Alert, no signs of acute distress.

## 2015-06-04 NOTE — ED Notes (Signed)
No answer x2 

## 2015-06-04 NOTE — ED Notes (Signed)
No answer x3

## 2015-06-04 NOTE — ED Notes (Signed)
Patient here with complaint of lower abdominal pain. States that it began around 5 pm today. Accompanied by nausea and diarrhea.

## 2015-06-04 NOTE — ED Provider Notes (Signed)
Dawn Lyons is a 39 y.o. female who presents to Urgent Care today for abdominal pain. Patient developed acute onset of severe abdominal pain today at about 5 PM. The pain awoke her from sleep. She's developed diarrhea but does not have any vomiting. She does feel nauseated. No fevers or chills. She has not tried any medications yet. She has a history of laparoscopic surgery for endometriosis. Her pain is not consistent with previous episodes of endometriosis. Her last menstrual period was May 21.   Past Medical History  Diagnosis Date  . Endometriosis   . PID (pelvic inflammatory disease)   . Trichimoniasis   . Fibroid uterus    Past Surgical History  Procedure Laterality Date  . Laparoscopic endometriosis fulguration     History  Substance Use Topics  . Smoking status: Never Smoker   . Smokeless tobacco: Not on file  . Alcohol Use: No   ROS as above Medications: No current facility-administered medications for this encounter.   Current Outpatient Prescriptions  Medication Sig Dispense Refill  . ciprofloxacin (CIPRO) 250 MG tablet Take 1 tablet (250 mg total) by mouth every 12 (twelve) hours. 10 tablet 0  . doxycycline (VIBRAMYCIN) 100 MG capsule Take 1 capsule (100 mg total) by mouth 2 (two) times daily. 20 capsule 0  . ibuprofen (ADVIL,MOTRIN) 800 MG tablet Take 1 tablet (800 mg total) by mouth 3 (three) times daily. 30 tablet 0  . mupirocin cream (BACTROBAN) 2 % Apply 1 application topically 2 (two) times daily. 15 g 0  . naproxen sodium (ANAPROX) 220 MG tablet Take 440 mg by mouth daily as needed (headache).     No Known Allergies   Exam:  BP 119/77 mmHg  Pulse 84  Temp(Src) 98.5 F (36.9 C) (Oral)  Resp 16  SpO2 100%  LMP 05/21/2015 Gen: Well NAD HEENT: EOMI,  MMM Lungs: Normal work of breathing. CTABL Heart: RRR no MRG Abd: NABS, . Tender to palpation lower abdomen with guarding Exts: Brisk capillary refill, warm and well perfused.   Results for orders  placed or performed during the hospital encounter of 06/04/15 (from the past 24 hour(s))  POCT urinalysis dip (device)     Status: Abnormal   Collection Time: 06/04/15  6:55 PM  Result Value Ref Range   Glucose, UA NEGATIVE NEGATIVE mg/dL   Bilirubin Urine NEGATIVE NEGATIVE   Ketones, ur NEGATIVE NEGATIVE mg/dL   Specific Gravity, Urine >=1.030 1.005 - 1.030   Hgb urine dipstick TRACE (A) NEGATIVE   pH 5.5 5.0 - 8.0   Protein, ur NEGATIVE NEGATIVE mg/dL   Urobilinogen, UA 0.2 0.0 - 1.0 mg/dL   Nitrite NEGATIVE NEGATIVE   Leukocytes, UA TRACE (A) NEGATIVE  Pregnancy, urine POC     Status: None   Collection Time: 06/04/15  6:59 PM  Result Value Ref Range   Preg Test, Ur NEGATIVE NEGATIVE   No results found.  Assessment and Plan: 39 y.o. female with abdominal pain. Transfer to the emergency department for evaluation and management.  Discussed warning signs or symptoms. Please see discharge instructions. Patient expresses understanding.     Gregor Hams, MD 06/04/15 Lurline Hare

## 2015-07-09 ENCOUNTER — Emergency Department (HOSPITAL_COMMUNITY)
Admission: EM | Admit: 2015-07-09 | Discharge: 2015-07-09 | Disposition: A | Discharge status: Home / Self Care | Payer: BLUE CROSS/BLUE SHIELD | Source: Home / Self Care

## 2015-07-09 ENCOUNTER — Encounter (HOSPITAL_COMMUNITY): Payer: Self-pay | Admitting: Emergency Medicine

## 2015-07-09 DIAGNOSIS — G43101 Migraine with aura, not intractable, with status migrainosus: Secondary | ICD-10-CM | POA: Diagnosis not present

## 2015-07-09 MED ORDER — KETOROLAC TROMETHAMINE 60 MG/2ML IM SOLN
60.0000 mg | Freq: Once | INTRAMUSCULAR | Status: AC
  Administered 2015-07-09: 60 mg via INTRAMUSCULAR

## 2015-07-09 MED ORDER — KETOROLAC TROMETHAMINE 60 MG/2ML IM SOLN
INTRAMUSCULAR | Status: AC
  Filled 2015-07-09: qty 2

## 2015-07-09 MED ORDER — PROMETHAZINE HCL 25 MG PO TABS: 25.0000 mg | ORAL_TABLET | Freq: Four times a day (QID) | ORAL | Status: DC | PRN

## 2015-07-09 NOTE — ED Notes (Signed)
C/o headache Denies sensitive to light States behind eye does hurt and cause eye to water

## 2015-07-09 NOTE — Discharge Instructions (Signed)

## 2015-07-09 NOTE — ED Provider Notes (Addendum)
CSN: [id number]     Arrival date & time 07/09/15  1941 History   None    Chief Complaint  Patient presents with  . Headache   (Consider location/radiation/quality/duration/timing/severity/associated sxs/prior Treatment) Patient is a 39 y.o. female presenting with headaches.  Headache Pain location:  Frontal Quality:  Dull and stabbing Radiates to:  Eyes Severity currently:  6/10 Severity at highest:  6/10 Onset quality:  Gradual Duration:  3 hours Timing:  Constant Progression:  Worsening Chronicity:  New Similar to prior headaches: yes   Context: bright light and loud noise   Relieved by:  Nothing Worsened by:  Nothing Ineffective treatments:  NSAIDs Associated symptoms: dizziness, eye pain, facial pain, nausea, photophobia, sinus pressure and visual change   Associated symptoms: no abdominal pain, no back pain, no blurred vision, no drainage, no ear pain, no focal weakness, no hearing loss, no loss of balance, no myalgias, no near-syncope, no neck pain, no neck stiffness, no numbness, no paresthesias, no seizures, no sore throat, no syncope and no vomiting    This is a 34 year old Luana Shu was brought over by a friend because of headache involving the left eye and retro-orbital area extending to the face and side of the head, beginning at 5:00 this evening. This is reminiscent of previous migraines that she's had. Usually the pain is relieved by Aleve but in this case, her two Aleve did not relieve her pain Past Medical History  Diagnosis Date  . Endometriosis   . PID (pelvic inflammatory disease)   . Trichimoniasis   . Fibroid uterus    Past Surgical History  Procedure Laterality Date  . Laparoscopic endometriosis fulguration     Family History  Problem Relation Age of Onset  . Hypertension Mother   . Hypertension Sister   . Diabetes Maternal Grandmother    History  Substance Use Topics  . Smoking status: Never Smoker   . Smokeless tobacco: Not on file  . Alcohol Use:  No   OB History    Gravida Para Term Preterm AB TAB SAB Ectopic Multiple Living   0              Review of Systems  Constitutional: Negative.   HENT: Positive for sinus pressure. Negative for ear pain, hearing loss, postnasal drip, rhinorrhea, sore throat and tinnitus.   Eyes: Positive for photophobia and pain. Negative for blurred vision.  Respiratory: Negative.   Cardiovascular: Negative.  Negative for syncope and near-syncope.  Gastrointestinal: Positive for nausea. Negative for vomiting and abdominal pain.  Genitourinary: Negative.   Musculoskeletal: Negative for myalgias, back pain, neck pain and neck stiffness.  Neurological: Positive for dizziness and headaches. Negative for focal weakness, seizures, numbness, paresthesias and loss of balance.  Psychiatric/Behavioral: Negative for sleep disturbance and agitation.    Allergies  Review of patient's allergies indicates no known allergies.  Home Medications   Prior to Admission medications   Medication Sig Start Date End Date Taking? Authorizing Provider  ciprofloxacin (CIPRO) 250 MG tablet Take 1 tablet (250 mg total) by mouth every 12 (twelve) hours. 01/19/15   Luvenia Redden, PA-C  doxycycline (VIBRAMYCIN) 100 MG capsule Take 1 capsule (100 mg total) by mouth 2 (two) times daily. 04/09/15   Janne Napoleon, NP  ibuprofen (ADVIL,MOTRIN) 800 MG tablet Take 1 tablet (800 mg total) by mouth 3 (three) times daily. 05/07/15   Tereasa Coop, PA-C  mupirocin cream (BACTROBAN) 2 % Apply 1 application topically 2 (two) times daily. 04/09/15  Janne Napoleon, NP  naproxen sodium (ANAPROX) 220 MG tablet Take 440 mg by mouth daily as needed (headache).    Historical Provider, MD   BP 115/79 mmHg  Pulse 72  Temp(Src) 97.3 F (36.3 C) (Oral)  Resp 16  SpO2 100%  LMP 07/04/2015 Physical Exam  Constitutional: She is oriented to person, place, and time. She appears well-developed and well-nourished.  HENT:  Head: Normocephalic and atraumatic.   Right Ear: External ear normal.  Left Ear: External ear normal.  Nose: Nose normal.  Mouth/Throat: Oropharynx is clear and moist.  Eyes: Conjunctivae and EOM are normal. Pupils are equal, round, and reactive to light. Right eye exhibits no discharge. Left eye exhibits no discharge. No scleral icterus.  Pupils are symmetric 2-3 mm  Neck: Normal range of motion. Neck supple.  Cardiovascular: Normal rate and normal heart sounds.   Pulmonary/Chest: Breath sounds normal.  Musculoskeletal: Normal range of motion.  Neurological: She is alert and oriented to person, place, and time. No cranial nerve deficit. She exhibits normal muscle tone. Coordination normal.  Skin: Skin is warm and dry.  Psychiatric: She has a normal mood and affect. Her behavior is normal.  Nursing note and vitals reviewed.   ED Course  Procedures (including critical care time) Labs Review   MDM   No red flags noted on physical exam and history seems rather benign. Patient's in no extreme distress so think we can treat this is a migraine with aura.    ICD-9-CM ICD-10-CM   1. Migraine with aura and with status migrainosus, not intractable 346.03 G43.101 promethazine (PHENERGAN) 25 MG tablet    Toradol 60 mg IM injection given  Signed, Robyn Haber, MD     Robyn Haber, MD 07/09/15 6203  Robyn Haber, MD 07/20/15 1308

## 2015-08-16 ENCOUNTER — Encounter (HOSPITAL_COMMUNITY): Payer: Self-pay | Admitting: Emergency Medicine

## 2015-08-16 ENCOUNTER — Emergency Department (HOSPITAL_COMMUNITY)
Admission: EM | Admit: 2015-08-16 | Discharge: 2015-08-16 | Disposition: A | Discharge status: Home / Self Care | Payer: BLUE CROSS/BLUE SHIELD | Source: Home / Self Care

## 2015-08-16 DIAGNOSIS — G43101 Migraine with aura, not intractable, with status migrainosus: Secondary | ICD-10-CM

## 2015-08-16 MED ORDER — PROMETHAZINE HCL 25 MG PO TABS: 25.0000 mg | ORAL_TABLET | Freq: Four times a day (QID) | ORAL | Status: DC | PRN

## 2015-08-16 NOTE — ED Provider Notes (Signed)
CSN: [id number]     Arrival date & time 08/16/15  1313 History   None    Chief Complaint  Patient presents with  . Headache   (Consider location/radiation/quality/duration/timing/severity/associated sxs/prior Treatment) HPI Comments: Pt requesting work note and nausea meds, last ate pizza this am-kept it down, 1 episode of diarrhea. States these are normal s/s of her MHA.  Patient is a 39 y.o. female presenting with headaches. The history is provided by the patient. No language interpreter was used.  Headache Pain location:  Generalized Quality:  Dull Radiates to:  Does not radiate Severity currently:  5/10 Severity at highest:  9/10 Onset quality:  Sudden Timing:  Constant Progression:  Improving Chronicity:  Recurrent Similar to prior headaches: yes   Context: emotional stress   Context comment:  Pt states aleve has started to work, also reports upset stomach x 1 day, no known illness expsoure, states she is stressed out at work, working more 12 hr shifts. States s/s are consistent w MHA/Stress Relieved by:  NSAIDs Exacerbated by: work/stress. Associated symptoms: abdominal pain, diarrhea and nausea   Associated symptoms: no blurred vision, no dizziness, no eye pain, no fever, no focal weakness, no loss of balance, no myalgias, no near-syncope, no neck pain, no numbness, no paresthesias, no photophobia, no syncope, no visual change and no vomiting     Past Medical History  Diagnosis Date  . Endometriosis   . PID (pelvic inflammatory disease)   . Trichimoniasis   . Fibroid uterus    Past Surgical History  Procedure Laterality Date  . Laparoscopic endometriosis fulguration     Family History  Problem Relation Age of Onset  . Hypertension Mother   . Hypertension Sister   . Diabetes Maternal Grandmother    Social History  Substance Use Topics  . Smoking status: Never Smoker   . Smokeless tobacco: None  . Alcohol Use: No   OB History    Gravida Para Term Preterm AB  TAB SAB Ectopic Multiple Living   0              Review of Systems  Constitutional: Positive for appetite change. Negative for fever, chills and activity change.  Eyes: Negative for blurred vision, photophobia and pain.  Respiratory: Negative for shortness of breath.   Cardiovascular: Negative for chest pain, syncope and near-syncope.  Gastrointestinal: Positive for nausea, abdominal pain and diarrhea. Negative for vomiting and constipation.  Endocrine: Negative.   Genitourinary: Negative for dysuria, hematuria, vaginal bleeding and vaginal discharge.  Musculoskeletal: Negative for myalgias and neck pain.  Skin: Negative for rash.  Allergic/Immunologic: Negative.   Neurological: Positive for headaches. Negative for dizziness, focal weakness, numbness, paresthesias and loss of balance.  Hematological: Negative.   Psychiatric/Behavioral: Negative.   All other systems reviewed and are negative.   Allergies  Review of patient's allergies indicates no known allergies.  Home Medications   Prior to Admission medications   Medication Sig Start Date End Date Taking? Authorizing Provider  promethazine (PHENERGAN) 25 MG tablet Take 1 tablet (25 mg total) by mouth every 6 (six) hours as needed for nausea or vomiting. 04/08/80   Tori Milks, NP   BP 191/47 mmHg  Pulse 80  Temp(Src) 98 F (36.7 C) (Oral)  Resp 16  SpO2 98%  LMP 07/21/2015 Physical Exam  Constitutional: She is oriented to person, place, and time. Vital signs are normal. She appears well-developed and well-nourished. She is active and cooperative.  Non-toxic appearance. She does not have a  sickly appearance. She does not appear ill. No distress.  HENT:  Head: Normocephalic.  Right Ear: Tympanic membrane normal.  Left Ear: Tympanic membrane normal.  Nose: Nose normal.  Mouth/Throat: Uvula is midline, oropharynx is clear and moist and mucous membranes are normal.  Eyes: Conjunctivae, EOM and lids are normal. Pupils  are equal, round, and reactive to light.  PERRL 94mm-2mm bialteral, no nystagmus  Neck: Trachea normal.  Cardiovascular: Normal rate, regular rhythm, normal heart sounds and normal pulses.   Pulmonary/Chest: Effort normal and breath sounds normal.  Abdominal: Soft. Normal appearance. She exhibits no distension, no pulsatile midline mass and no mass. Bowel sounds are increased. There is generalized tenderness. There is no rigidity, no rebound, no guarding, no tenderness at McBurney's point and negative Murphy's sign.  Neurological: She is alert and oriented to person, place, and time. She has normal strength. No cranial nerve deficit or sensory deficit. Gait normal. GCS eye subscore is 4. GCS verbal subscore is 5. GCS motor subscore is 6.  Skin: Skin is warm, dry and intact. No rash noted. She is not diaphoretic. No pallor.  Psychiatric: She has a normal mood and affect. Her speech is normal.  Nursing note and vitals reviewed.   ED Course  Procedures (including critical care time) Labs Review Labs Reviewed - No data to display  Imaging Review No results found.   MDM   1. Migraine with aura and with status migrainosus, not intractable     Rest,push fluids(water,gatorade, jello,popsicles), take meds as directed. Follow up with PCP of your [facility] or [facility]-call for appt. Go to Er immediately if develop worsening s/s (loss of function, worsening HA, etc).   States HA is improving after Aleve PTA, wants to try outpatient treatment, go home and rest.  Tori Milks, NP 88/41/66 0630

## 2015-08-16 NOTE — ED Notes (Signed)
C/o headache States she is having abd pain, nausea, and headache States sx started yesterday States she has hx of headaches Aleve was used as tx

## 2015-08-16 NOTE — Discharge Instructions (Signed)
Rest,push fluids(water,gatorade, jello,popsicles), take meds as directed. Follow up with PCP of your [facility] or [facility]-call for appt. Go to Er immediately if develop worsening s/s (loss of function, worsening HA, etc).

## 2015-10-24 ENCOUNTER — Emergency Department (HOSPITAL_COMMUNITY)
Admission: EM | Admit: 2015-10-24 | Discharge: 2015-10-24 | Disposition: A | Discharge status: Home / Self Care | Payer: Self-pay | Source: Home / Self Care | Attending: Family Medicine | Admitting: Family Medicine

## 2015-10-24 ENCOUNTER — Encounter (HOSPITAL_COMMUNITY): Payer: Self-pay | Admitting: Emergency Medicine

## 2015-10-24 DIAGNOSIS — G43009 Migraine without aura, not intractable, without status migrainosus: Secondary | ICD-10-CM

## 2015-10-24 MED ORDER — KETOROLAC TROMETHAMINE 60 MG/2ML IM SOLN
60.0000 mg | Freq: Once | INTRAMUSCULAR | Status: AC
  Administered 2015-10-24: 60 mg via INTRAMUSCULAR

## 2015-10-24 MED ORDER — ONDANSETRON 4 MG PO TBDP
4.0000 mg | ORAL_TABLET | Freq: Once | ORAL | Status: AC
  Administered 2015-10-24: 4 mg via ORAL

## 2015-10-24 MED ORDER — ONDANSETRON 4 MG PO TBDP
ORAL_TABLET | ORAL | Status: AC
  Filled 2015-10-24: qty 1

## 2015-10-24 MED ORDER — SUMATRIPTAN SUCCINATE 100 MG PO TABS: 100.0000 mg | ORAL_TABLET | ORAL | Status: DC | PRN

## 2015-10-24 MED ORDER — KETOROLAC TROMETHAMINE 60 MG/2ML IM SOLN
INTRAMUSCULAR | Status: AC
  Filled 2015-10-24: qty 2

## 2015-10-24 NOTE — ED Provider Notes (Signed)
CSN: [id number]     Arrival date & time 10/24/15  1524 History   First MD Initiated Contact with Patient 10/24/15 1650     Chief Complaint  Patient presents with  . Migraine   (Consider location/radiation/quality/duration/timing/severity/associated sxs/prior Treatment) Patient is a 39 y.o. female presenting with migraines. The history is provided by the patient.  Migraine This is a recurrent problem. The current episode started 1 to 2 hours ago. The problem occurs rarely (notices they occur during menses). The problem has not changed since onset.Associated symptoms include headaches. Nothing aggravates the symptoms. Nothing relieves the symptoms. Treatments tried: 2 aleve tablets.    Past Medical History  Diagnosis Date  . Endometriosis   . PID (pelvic inflammatory disease)   . Trichimoniasis   . Fibroid uterus    Past Surgical History  Procedure Laterality Date  . Laparoscopic endometriosis fulguration     Family History  Problem Relation Age of Onset  . Hypertension Mother   . Hypertension Sister   . Diabetes Maternal Grandmother    Social History  Substance Use Topics  . Smoking status: Never Smoker   . Smokeless tobacco: None  . Alcohol Use: No   OB History    Gravida Para Term Preterm AB TAB SAB Ectopic Multiple Living   0              Review of Systems  Constitutional: Negative.   Eyes: Positive for photophobia.  Respiratory: Negative.   Cardiovascular: Negative.   Gastrointestinal: Positive for nausea.  Genitourinary: Negative.   Musculoskeletal: Negative.   Skin: Negative.   Allergic/Immunologic: Negative.   Neurological: Positive for headaches.  Hematological: Negative.   Psychiatric/Behavioral: Negative.     Allergies  Review of patient's allergies indicates no known allergies.  Home Medications   Prior to Admission medications   Medication Sig Start Date End Date Taking? Authorizing Provider  Naproxen Sodium (ALEVE) 220 MG CAPS Take 440 mg by  mouth.   Yes Historical Provider, MD  promethazine (PHENERGAN) 25 MG tablet Take 1 tablet (25 mg total) by mouth every 6 (six) hours as needed for nausea or vomiting. 03/31/01   Tori Milks, NP   Meds Ordered and Administered this Visit  Medications - No data to display  BP 139/92 mmHg  Pulse 78  Temp(Src) 98.3 F (36.8 C) (Oral)  Resp 16  SpO2 100%  LMP 10/15/2015 (Exact Date) No data found.   Physical Exam  Constitutional: She is oriented to person, place, and time.  Patient lying recumbant with lights out c/o phonophotophobia.  HENT:  Head: Normocephalic and atraumatic.  Mouth/Throat: Oropharynx is clear and moist.  Eyes: Conjunctivae and EOM are normal. Pupils are equal, round, and reactive to light.  Neck: Normal range of motion. Neck supple.  Cardiovascular: Normal rate, regular rhythm and normal heart sounds.   Pulmonary/Chest: Effort normal and breath sounds normal.  Abdominal: Soft. Bowel sounds are normal.  Musculoskeletal: Normal range of motion.  Neurological: She is alert and oriented to person, place, and time.  Psychiatric: She has a normal mood and affect.    ED Course  Procedures (including critical care time)  Labs Review Labs Reviewed - No data to display  Imaging Review No results found.   Visual Acuity Review  Right Eye Distance:   Left Eye Distance:   Bilateral Distance:    Right Eye Near:   Left Eye Near:    Bilateral Near:         MDM  Migraine headache w/o Aura  Zofran 4mg  po now Toradol 60mg  IM now Imitrex 100mg  po with headache and may reapeat in 2 hours prn #10  Follow up with PCP.  Lyons, FNP 10/24/15 [phone]

## 2015-10-24 NOTE — Discharge Instructions (Signed)

## 2015-10-24 NOTE — ED Notes (Signed)
Pt started with a headache around 1400 today.  She took two Aleve but it did not help.  Pt has history of migraines and two Aleve usually help.

## 2016-05-17 IMAGING — US US TRANSVAGINAL NON-OB
1 series · 13 of 25 positions shown · non-contrast
Comparison: None

CLINICAL DATA: Pelvic pain in a female 06/17/2014

EXAM:
TRANSABDOMINAL AND TRANSVAGINAL ULTRASOUND OF PELVIS
TECHNIQUE: Both transabdominal and transvaginal ultrasound examinations of the
pelvis were performed. Transabdominal technique was performed for
global imaging of the pelvis including uterus, ovaries, adnexal
regions, and pelvic cul-de-sac. It was necessary to proceed with
endovaginal exam following the transabdominal exam to visualize the
endometrium and ovaries.

[Series 1: us pelvis complete · 13 of 69 slices shown]
[im 1/69]
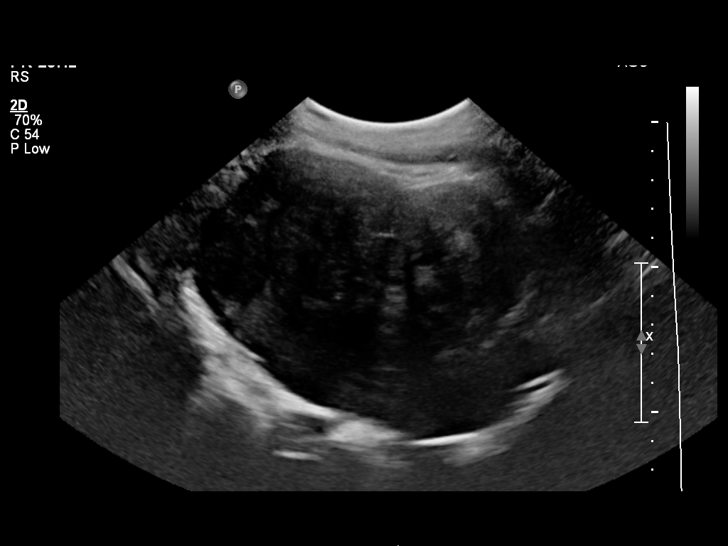
[im 6/69]
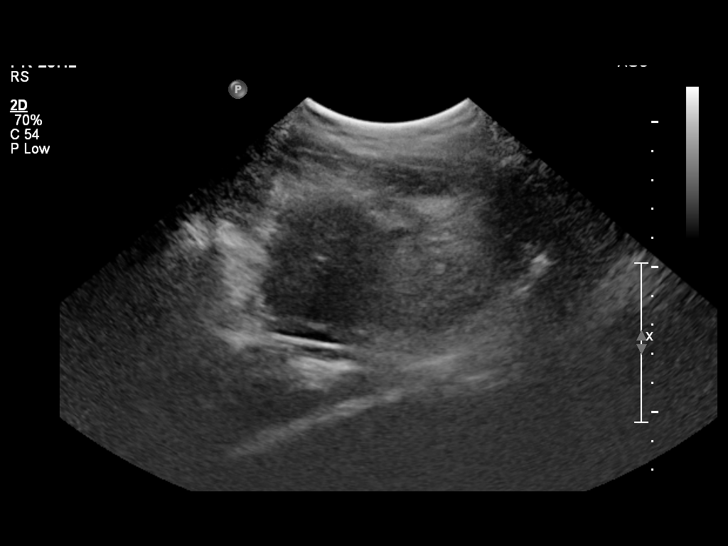
[im 12/69]
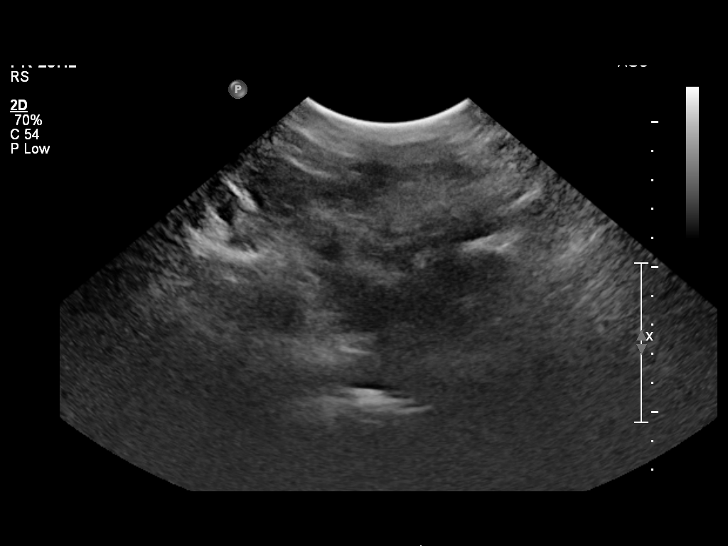
[im 18/69]
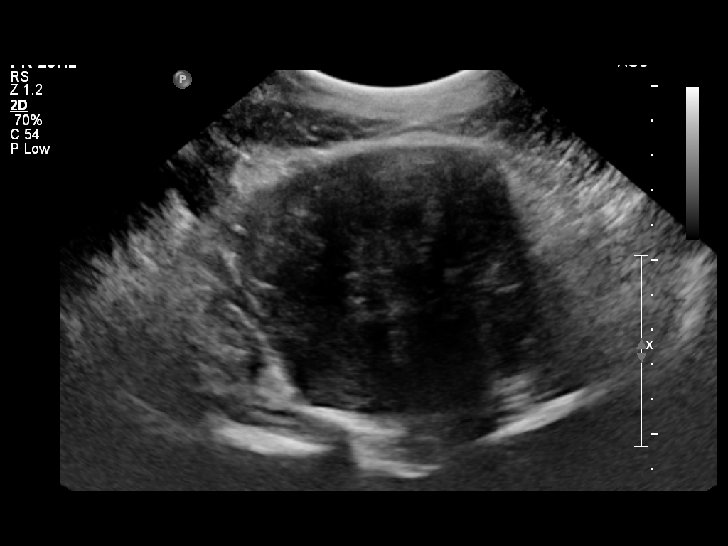
[im 23/69]
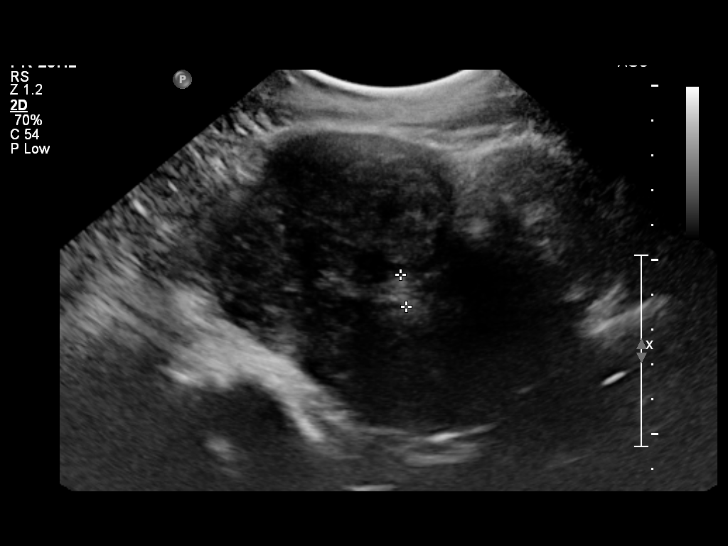
[im 29/69]
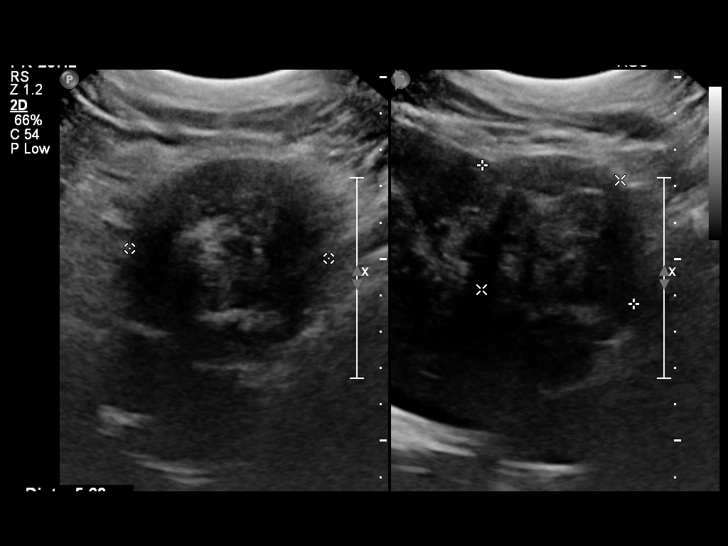
[im 35/69]
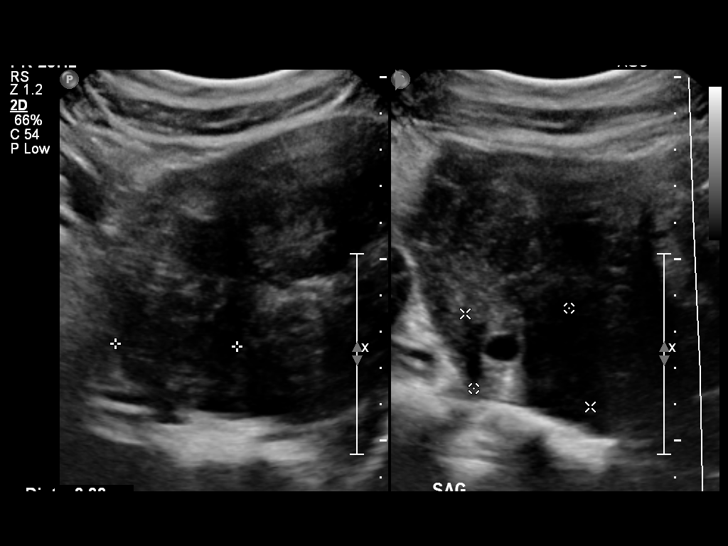
[im 40/69]
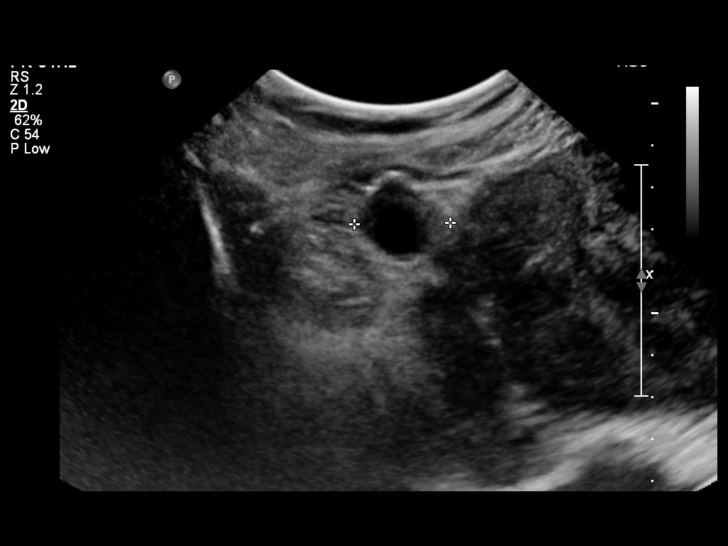
[im 46/69]
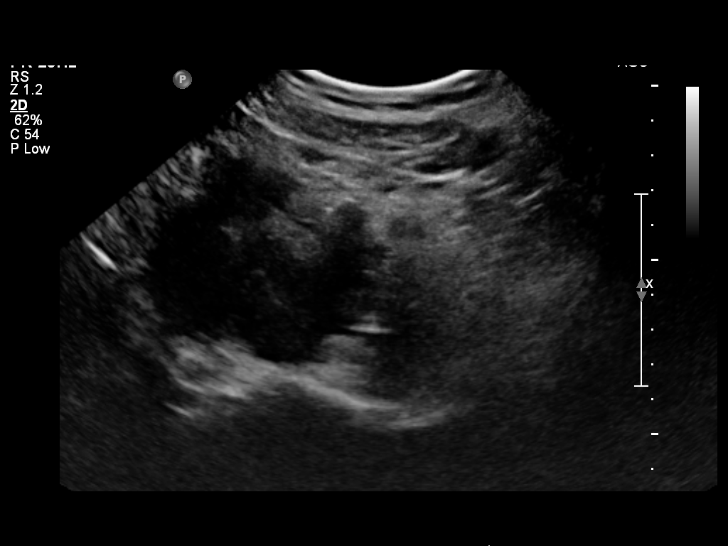
[im 52/69]
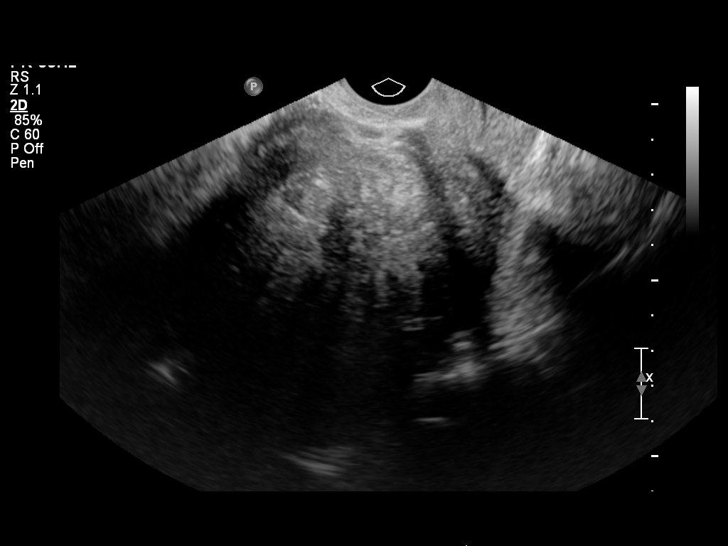
[im 57/69]
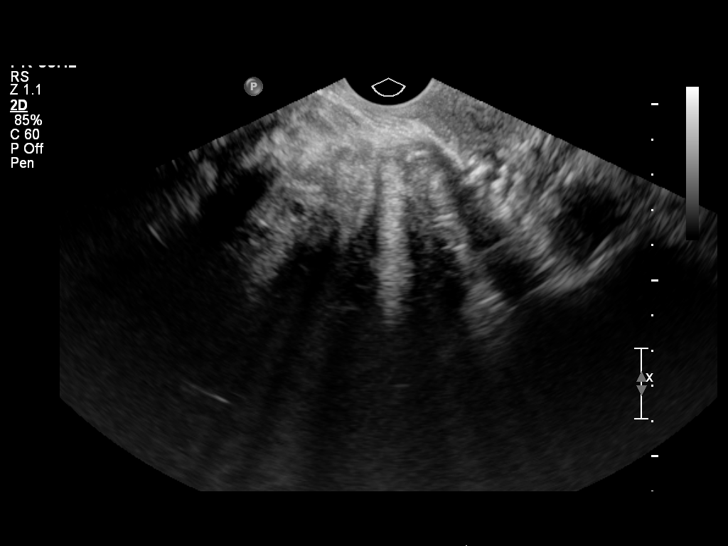
[im 63/69]
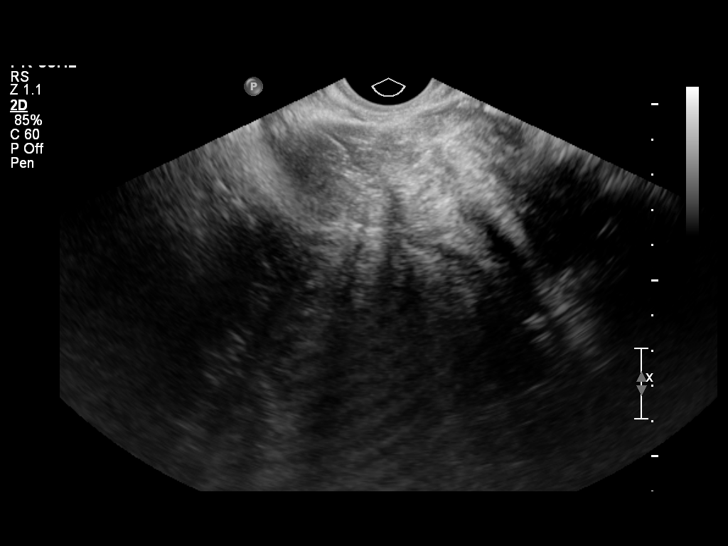
[im 69/69]
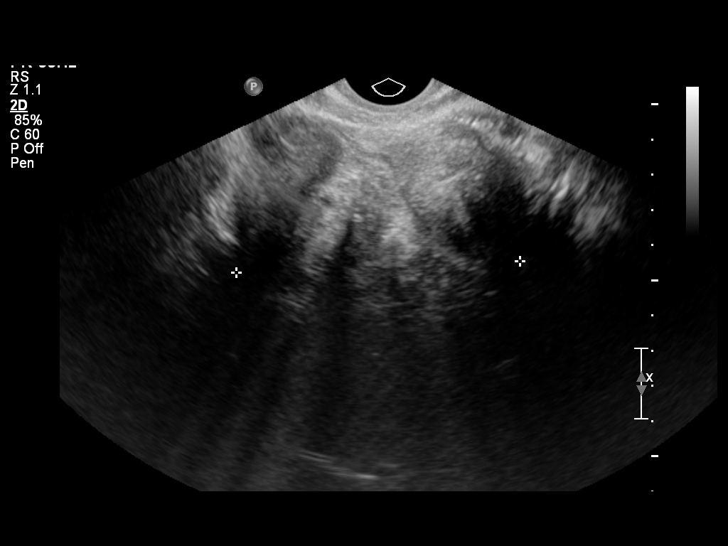

[13 of 25 positions shown; findings below may reference images not displayed]

FINDINGS: Uterus

Measurements: 12 x 8 x 8 cm. The uterus is distorted by multiple
heterogeneous, partly shadowing masses consistent with fibroids.
These extend from the serosal to mucosal surfaces with endometrial
distortion. Three measured fibroids:

*4.8 cm in the right uterine body
*5.4 cm in the uterine fundus
*5.6 cm in the left cornual region.

Endometrium

Thickness: 9 mm where visible.  No focal abnormality visualized.

Right ovary

Measurements: 3.2 x 1.9 x 2.3 cm. Normal appearance/no adnexal mass.

Left ovary

Measurements: 3 x 2.2 x 2 cm. Normal appearance/no adnexal mass.

Other findings

No free fluid.
IMPRESSION: 1. Stable pelvic ultrasound.
2. Multiple uterine fibroids, up to 5.6 cm diameter. The fibroids
distort the endometrium.

## 2019-09-18 ENCOUNTER — Other Ambulatory Visit: Payer: Self-pay | Admitting: Emergency Medicine

## 2019-09-18 ENCOUNTER — Ambulatory Visit (HOSPITAL_COMMUNITY)
Admission: EM | Admit: 2019-09-18 | Discharge: 2019-09-18 | Disposition: A | Discharge status: Home / Self Care | Payer: PRIVATE HEALTH INSURANCE | Attending: Family Medicine | Admitting: Family Medicine

## 2019-09-18 ENCOUNTER — Other Ambulatory Visit: Payer: Self-pay

## 2019-09-18 DIAGNOSIS — N9089 Other specified noninflammatory disorders of vulva and perineum: Secondary | ICD-10-CM

## 2019-09-18 DIAGNOSIS — N760 Acute vaginitis: Secondary | ICD-10-CM | POA: Diagnosis not present

## 2019-09-18 MED ORDER — FLUCONAZOLE 200 MG PO TABS: ORAL_TABLET | ORAL | 0 refills | Status: DC

## 2019-09-18 MED ORDER — METRONIDAZOLE 500 MG PO TABS: 500.0000 mg | ORAL_TABLET | Freq: Two times a day (BID) | ORAL | 0 refills | Status: AC

## 2019-09-18 NOTE — ED Provider Notes (Signed)
[facility]    CSN: [id number] Arrival date & time: 09/18/19  [id number]      History   Chief Complaint Chief Complaint  Patient presents with  . SEXUALLY TRANSMITTED DISEASE    HPI Dawn Lyons is a 43 y.o. female.   Burr Medico presents with complaints of "bump" to vulva. She states these are not new, she typically gets these in association with her period. However, currently she has two of these bumps which have not resolved with her period. LMP 8/26. They do not hurt or itch. Sometimes they will open and drain "pus."  Denies any vaginal discharge but states she has had an odor. States she tends to get this issue when she is sexually active. She has 1 partner, doesn't use condoms. No pelvic pain, back pain, fevers. No urinary symptoms. History  Of PID, endometriosis. Doesn't follow regularly with gynecology, doesn't know when her last PAP was.    ROS per HPI, negative if not otherwise mentioned.      Past Medical History:  Diagnosis Date  . Endometriosis   . Fibroid uterus   . PID (pelvic inflammatory disease)   . Trichimoniasis     There are no active problems to display for this patient.   Past Surgical History:  Procedure Laterality Date  . LAPAROSCOPIC ENDOMETRIOSIS FULGURATION      OB History    Gravida  0   Para      Term      Preterm      AB      Living        SAB      TAB      Ectopic      Multiple      Live Births               Home Medications    Prior to Admission medications   Medication Sig Start Date End Date Taking? Authorizing Provider  fluconazole (DIFLUCAN) 200 MG tablet Take once today. Take second pill at completion of antibiotics. 09/18/19   Zigmund Gottron, NP  metroNIDAZOLE (FLAGYL) 500 MG tablet Take 1 tablet (500 mg total) by mouth 2 (two) times daily for 7 days. 09/18/19 09/25/19  Zigmund Gottron, NP  Naproxen Sodium (ALEVE) 220 MG CAPS Take 440 mg by mouth.    [provider]  promethazine  (PHENERGAN) 25 MG tablet Take 1 tablet (25 mg total) by mouth every 6 (six) hours as needed for nausea or vomiting. [id number]   Defelice, Jeanett Schlein, NP  SUMAtriptan (IMITREX) 100 MG tablet Take 1 tablet (100 mg total) by mouth every 2 (two) hours as needed for migraine. May repeat in 2 hours if headache persists or recurs. 10/24/15   Lysbeth Penner, FNP    Family History Family History  Problem Relation Age of Onset  . Hypertension Mother   . Hypertension Sister   . Diabetes Maternal Grandmother     Social History Social History   Tobacco Use  . Smoking status: Never Smoker  Substance Use Topics  . Alcohol use: No  . Drug use: No     Allergies   Patient has no known allergies.   Review of Systems Review of Systems   Physical Exam Triage Vital Signs ED Triage Vitals  Enc Vitals Group     BP 09/18/19 0851 113/83     Pulse Rate 09/18/19 0851 98     Resp 09/18/19 0851 18  Temp 09/18/19 0851 97.8 F (36.6 C)     Temp Source 09/18/19 0851 Temporal     SpO2 09/18/19 0851 100 %     Weight --      Height --      Head Circumference --      Peak Flow --      Pain Score 09/18/19 0857 0     Pain Loc --      Pain Edu? --      Excl. in [place]? --    No data found.  Updated Vital Signs BP 113/83 (BP Location: Left Arm)   Pulse 98   Temp 97.8 F (36.6 C) (Temporal)   Resp 18   LMP 08/26/2019 (Exact Date)   SpO2 100%   Visual Acuity Right Eye Distance:   Left Eye Distance:   Bilateral Distance:    Right Eye Near:   Left Eye Near:    Bilateral Near:     Physical Exam Exam conducted with a chaperone present.  Constitutional:      General: She is not in acute distress.    Appearance: She is well-developed.  Cardiovascular:     Rate and Rhythm: Normal rate.  Pulmonary:     Effort: Pulmonary effort is normal.  Abdominal:     Tenderness: There is no abdominal tenderness. There is no right CVA tenderness or left CVA tenderness.  Genitourinary:    Vagina:  Vaginal discharge present.       Comments: Two pea sized palpable nodules to vulva, non tender, no redness; mobile; rubbery, no fluctuance; no drainage ; large white thick malodorous discharge noted Skin:    General: Skin is warm and dry.  Neurological:     Mental Status: She is alert and oriented to person, place, and time.      UC Treatments / Results  Labs (all labs ordered are listed, but only abnormal results are displayed) Labs Reviewed  CERVICOVAGINAL ANCILLARY ONLY    EKG   Radiology No results found.  Procedures Procedures (including critical care time)  Medications Ordered in UC Medications - No data to display  Initial Impression / Assessment and Plan / UC Course  I have reviewed the triage vital signs and the nursing notes.  Pertinent labs & imaging results that were available during my care of the patient were reviewed by me and considered in my medical decision making (see chart for details).     Two pea sized mobile masses to vulva, non tender, no redness or fluctuance. Lipoma vs cyst vs ingrown hair discussed and considered. No obvious indications of herpes at this time, which is what patient was concerned about. Flagyl and diflucan provided for vaginal discharge with odor with cytology pending. Encouraged follow up with gyne. Return precautions provided. Patient verbalized understanding and agreeable to plan.   Final Clinical Impressions(s) / UC Diagnoses   Final diagnoses:  Acute vaginitis  Vulvar lump     Discharge Instructions     We will treat you today for concern for bv and yeast infection. Don't drink alcohol while taking medication and withhold from intercourse until completed. I do not see indication of herpes lesions, I think these bumps you are feeling are benign- either ingrown hair bump vs cysts or lipomas. Warm soaks may help these.  I do recommend following up with gynecology for recheck and annual exam.  If these increase in size,  become red or painful please return to be seen.     ED  Prescriptions    Medication Sig Dispense Auth. Provider   metroNIDAZOLE (FLAGYL) 500 MG tablet Take 1 tablet (500 mg total) by mouth 2 (two) times daily for 7 days. 14 tablet Augusto Gamble B, NP   fluconazole (DIFLUCAN) 200 MG tablet Take once today. Take second pill at completion of antibiotics. 2 tablet Zigmund Gottron, NP     PDMP not reviewed this encounter.   Zigmund Gottron, NP 09/18/19 [phone]

## 2019-09-18 NOTE — Discharge Instructions (Addendum)
We will treat you today for concern for bv and yeast infection. Don't drink alcohol while taking medication and withhold from intercourse until completed. I do not see indication of herpes lesions, I think these bumps you are feeling are benign- either ingrown hair bump vs cysts or lipomas. Warm soaks may help these.  I do recommend following up with gynecology for recheck and annual exam.  If these increase in size, become red or painful please return to be seen.

## 2019-09-18 NOTE — ED Triage Notes (Signed)
Pt reports vaginal bumps that she gets during her period.  She states that this has been going on since she was in her 20's, but this time they did not got away at the end of her cycle.  She states the bumps are internal and external.

## 2019-09-22 LAB — CERVICOVAGINAL ANCILLARY ONLY
Chlamydia: NEGATIVE
Neisseria Gonorrhea: NEGATIVE

## 2019-09-23 ENCOUNTER — Ambulatory Visit (HOSPITAL_COMMUNITY)
Admission: EM | Admit: 2019-09-23 | Discharge: 2019-09-23 | Disposition: A | Discharge status: Home / Self Care | Payer: PRIVATE HEALTH INSURANCE | Attending: Family Medicine | Admitting: Family Medicine

## 2019-09-23 ENCOUNTER — Other Ambulatory Visit: Payer: Self-pay

## 2019-09-23 ENCOUNTER — Encounter (HOSPITAL_COMMUNITY): Payer: Self-pay | Admitting: Emergency Medicine

## 2019-09-23 DIAGNOSIS — Z3202 Encounter for pregnancy test, result negative: Secondary | ICD-10-CM | POA: Diagnosis not present

## 2019-09-23 DIAGNOSIS — R1030 Lower abdominal pain, unspecified: Secondary | ICD-10-CM | POA: Insufficient documentation

## 2019-09-23 LAB — POCT URINALYSIS DIP (DEVICE)
Glucose, UA: NEGATIVE mg/dL
Ketones, ur: 15 mg/dL — AB
Nitrite: NEGATIVE
Protein, ur: NEGATIVE mg/dL
Specific Gravity, Urine: >=1.03 (ref 1.005–1.030)
Urobilinogen, UA: 1 mg/dL (ref 0.0–1.0)
pH: 5.5 (ref 5.0–8.0)

## 2019-09-23 LAB — POCT PREGNANCY, URINE: Preg Test, Ur: NEGATIVE

## 2019-09-23 MED ORDER — NAPROXEN 500 MG PO TABS: 500.0000 mg | ORAL_TABLET | Freq: Two times a day (BID) | ORAL | 0 refills | Status: DC

## 2019-09-23 NOTE — ED Notes (Signed)
Patient able to ambulate independently  

## 2019-09-23 NOTE — ED Triage Notes (Signed)
Pt presents to [facility] for assessment of lower bilateral abdominal pain, treated for [facility] and yeast infection last Friday.  States she was having cramps as well from her cycle.  States she has had a heavy cycle this time.  Denies n/v/d, c/o some constipation.

## 2019-09-23 NOTE — Discharge Instructions (Signed)
Urine culture pending, should return in approximately 2 days Please use naprosyn twice daily for pain Follow up with OBGYN for repeat ultrasound Follow up in emergency room if pain worsening

## 2019-09-23 NOTE — ED Provider Notes (Signed)
[facility]    CSN: [id number] Arrival date & time: 09/23/19  1236      History   Chief Complaint Chief Complaint  Patient presents with  . APPT: 1230  . Abdominal Pain    HPI Dawn Lyons is a 43 y.o. female history of endometriosis, fibroids, PID, presenting today for evaluation of abdominal pain.  Patient states that she was seen here on Friday, approximately 5 days ago and treated for BV and yeast.  She took Diflucan as well as metronidazole.  On Sunday, 2 days after her visit she started her menstrual cycle which was heavy and had a lot of cramping.  She states that her cramping has improved, but is started to have more of a pressure sensation in his her lower abdomen.  Denies any nausea vomiting or diarrhea.  Had 1 small bowel movement today.  Has been eating and drinking like normal without worsening of pain.  Pressure feels mainly pelvic.  Has had mild increase in frequency and slight tingling, but this resolved after she showered.  HPI  Past Medical History:  Diagnosis Date  . Endometriosis   . Fibroid uterus   . PID (pelvic inflammatory disease)   . Trichimoniasis     There are no active problems to display for this patient.   Past Surgical History:  Procedure Laterality Date  . LAPAROSCOPIC ENDOMETRIOSIS FULGURATION      OB History    Gravida  0   Para      Term      Preterm      AB      Living        SAB      TAB      Ectopic      Multiple      Live Births               Home Medications    Prior to Admission medications   Medication Sig Start Date End Date Taking? Authorizing Provider  fluconazole (DIFLUCAN) 200 MG tablet Take once today. Take second pill at completion of antibiotics. 09/18/19   Zigmund Gottron, NP  metroNIDAZOLE (FLAGYL) 500 MG tablet Take 1 tablet (500 mg total) by mouth 2 (two) times daily for 7 days. 09/18/19 09/25/19  Zigmund Gottron, NP  naproxen (NAPROSYN) 500 MG tablet Take 1 tablet (500 mg  total) by mouth 2 (two) times daily. 09/23/19   Amanat Hackel C, PA-C  Naproxen Sodium (ALEVE) 220 MG CAPS Take 440 mg by mouth.    [provider]  promethazine (PHENERGAN) 25 MG tablet Take 1 tablet (25 mg total) by mouth every 6 (six) hours as needed for nausea or vomiting. [id number]   Defelice, Jeanett Schlein, NP  SUMAtriptan (IMITREX) 100 MG tablet Take 1 tablet (100 mg total) by mouth every 2 (two) hours as needed for migraine. May repeat in 2 hours if headache persists or recurs. 10/24/15   Lysbeth Penner, FNP    Family History Family History  Problem Relation Age of Onset  . Hypertension Mother   . Hypertension Sister   . Diabetes Maternal Grandmother     Social History Social History   Tobacco Use  . Smoking status: Never Smoker  . Smokeless tobacco: Never Used  Substance Use Topics  . Alcohol use: No  . Drug use: No     Allergies   Patient has no known allergies.   Review of Systems Review of Systems  Constitutional: Negative for  fever.  Respiratory: Negative for shortness of breath.   Cardiovascular: Negative for chest pain.  Gastrointestinal: Positive for abdominal pain. Negative for diarrhea, nausea and vomiting.  Genitourinary: Positive for vaginal bleeding. Negative for dysuria, flank pain, genital sores, hematuria, menstrual problem, vaginal discharge and vaginal pain.  Musculoskeletal: Negative for back pain.  Skin: Negative for rash.  Neurological: Negative for dizziness, light-headedness and headaches.     Physical Exam Triage Vital Signs ED Triage Vitals  Enc Vitals Group     BP 09/23/19 1252 121/76     Pulse Rate 09/23/19 1252 (!) 111     Resp 09/23/19 1252 18     Temp 09/23/19 1252 97.7 F (36.5 C)     Temp Source 09/23/19 1252 Tympanic     SpO2 09/23/19 1252 100 %     Weight --      Height --      Head Circumference --      Peak Flow --      Pain Score 09/23/19 1251 9     Pain Loc --      Pain Edu? --      Excl. in [place]? --     No data found.  Updated Vital Signs BP 121/76 (BP Location: Right Arm)   Pulse (!) 111   Temp 97.7 F (36.5 C) (Tympanic)   Resp 18   LMP 08/26/2019 (Exact Date)   SpO2 100%   Visual Acuity Right Eye Distance:   Left Eye Distance:   Bilateral Distance:    Right Eye Near:   Left Eye Near:    Bilateral Near:     Physical Exam Vitals signs and nursing note reviewed.  Constitutional:      General: She is not in acute distress.    Appearance: She is well-developed.  HENT:     Head: Normocephalic and atraumatic.  Eyes:     Conjunctiva/sclera: Conjunctivae normal.  Neck:     Musculoskeletal: Neck supple.  Cardiovascular:     Rate and Rhythm: Normal rate and regular rhythm.     Heart sounds: No murmur.  Pulmonary:     Effort: Pulmonary effort is normal. No respiratory distress.     Breath sounds: Normal breath sounds.  Abdominal:     Palpations: Abdomen is soft.     Tenderness: There is no abdominal tenderness.     Comments: Tenderness to palpation over left lower quadrant lower abdominal abdomen firm to suprapubic area and mid lower abdomen, nontender over this area  Genitourinary:    Comments: Normal external female genitalia, soft skin colored bumps noted to right labia majora, nontender to touch  Bright red blood present in vaginal vault  Cervix without erythema, does not appear friable  Moderate discomfort with exam, cervical motion tenderness as well as bilateral adnexal tenderness, and no palpable masses Skin:    General: Skin is warm and dry.  Neurological:     Mental Status: She is alert.      UC Treatments / Results  Labs (all labs ordered are listed, but only abnormal results are displayed) Labs Reviewed  POCT URINALYSIS DIP (DEVICE) - Abnormal; Notable for the following components:      Result Value   Bilirubin Urine SMALL (*)    Ketones, ur 15 (*)    Hgb urine dipstick TRACE (*)    Leukocytes,Ua TRACE (*)    All other components within normal  limits  URINE CULTURE  POC URINE PREG, ED  POCT PREGNANCY, URINE  EKG   Radiology No results found.  Procedures Procedures (including critical care time)  Medications Ordered in UC Medications - No data to display  Initial Impression / Assessment and Plan / UC Course  I have reviewed the triage vital signs and the nursing notes.  Pertinent labs & imaging results that were available during my care of the patient were reviewed by me and considered in my medical decision making (see chart for details).     Pregnancy test negative, trace leuks, will send for culture to definitively rule out UTI.  Given palpable uterus as well as heavy bleeding, likely related to fibroids given her history.  Do not suspect STDs/PID at this time.  Will treat with Naprosyn to treat discomfort, recommending outpatient follow-up for ultrasound with OB/GYN.  Follow-up in ED if symptoms progressing or worsening.Discussed strict return precautions. Patient verbalized understanding and is agreeable with plan.  Final Clinical Impressions(s) / UC Diagnoses   Final diagnoses:  Lower abdominal pain     Discharge Instructions     Urine culture pending, should return in approximately 2 days Please use naprosyn twice daily for pain Follow up with OBGYN for repeat ultrasound Follow up in emergency room if pain worsening    ED Prescriptions    Medication Sig Dispense Auth. Provider   naproxen (NAPROSYN) 500 MG tablet Take 1 tablet (500 mg total) by mouth 2 (two) times daily. 30 tablet Emori Mumme, [place] C, PA-C     PDMP not reviewed this encounter.   Janith Lima, [place] 09/23/19 1437

## 2019-09-24 LAB — URINE CULTURE: Culture: NO GROWTH

## 2019-09-29 ENCOUNTER — Telehealth: Payer: Self-pay | Admitting: Family Medicine

## 2019-09-29 NOTE — Telephone Encounter (Signed)
Patient was seen in the [facility] for Lower abdominal pain. I was not able to reach her to get her a scheduled appointment in our office.

## 2019-10-02 LAB — CERVICOVAGINAL ANCILLARY ONLY
Bacterial Vaginitis (gardnerella): POSITIVE — AB
Candida Glabrata: NEGATIVE
Candida Vaginitis: POSITIVE — AB
Molecular Disclaimer: NEGATIVE
Molecular Disclaimer: NEGATIVE
Molecular Disclaimer: NEGATIVE
Molecular Disclaimer: NORMAL
Trichomonas: NEGATIVE

## 2020-08-25 ENCOUNTER — Encounter (HOSPITAL_COMMUNITY): Payer: Self-pay | Admitting: Emergency Medicine

## 2020-08-25 ENCOUNTER — Telehealth (HOSPITAL_COMMUNITY): Payer: Self-pay | Admitting: Family Medicine

## 2020-08-25 ENCOUNTER — Other Ambulatory Visit: Payer: Self-pay

## 2020-08-25 ENCOUNTER — Ambulatory Visit (HOSPITAL_COMMUNITY)
Admission: EM | Admit: 2020-08-25 | Discharge: 2020-08-25 | Disposition: A | Discharge status: Home / Self Care | Payer: PRIVATE HEALTH INSURANCE | Attending: Family Medicine | Admitting: Family Medicine

## 2020-08-25 DIAGNOSIS — R600 Localized edema: Secondary | ICD-10-CM | POA: Diagnosis not present

## 2020-08-25 LAB — CBC
HCT: 23.2 % — ABNORMAL LOW (ref 36.0–46.0)
Hemoglobin: 6.1 g/dL — CL (ref 12.0–15.0)
MCH: 16.8 pg — ABNORMAL LOW (ref 26.0–34.0)
MCHC: 26.3 g/dL — ABNORMAL LOW (ref 30.0–36.0)
MCV: 63.7 fL — ABNORMAL LOW (ref 80.0–100.0)
Platelets: 362 10*3/uL (ref 150–400)
RBC: 3.64 MIL/uL — ABNORMAL LOW (ref 3.87–5.11)
RDW: 20.2 % — ABNORMAL HIGH (ref 11.5–15.5)
WBC: 6.9 10*3/uL (ref 4.0–10.5)
nRBC: 0 % (ref 0.0–0.2)

## 2020-08-25 LAB — COMPREHENSIVE METABOLIC PANEL
ALT: 11 U/L (ref 0–44)
AST: 22 U/L (ref 15–41)
Albumin: 3.5 g/dL (ref 3.5–5.0)
Alkaline Phosphatase: 55 U/L (ref 38–126)
Anion gap: 8 (ref 5–15)
BUN: 9 mg/dL (ref 6–20)
CO2: 24 mmol/L (ref 22–32)
Calcium: 9.1 mg/dL (ref 8.9–10.3)
Chloride: 108 mmol/L (ref 98–111)
Creatinine, Ser: 1.01 mg/dL — ABNORMAL HIGH (ref 0.44–1.00)
GFR calc Af Amer: >60 mL/min (ref >60)
GFR calc non Af Amer: >60 mL/min (ref >60)
Glucose, Bld: 88 mg/dL (ref 70–99)
Potassium: 3.4 mmol/L — ABNORMAL LOW (ref 3.5–5.1)
Sodium: 140 mmol/L (ref 135–145)
Total Bilirubin: 0.2 mg/dL — ABNORMAL LOW (ref 0.3–1.2)
Total Protein: 7.4 g/dL (ref 6.5–8.1)

## 2020-08-25 LAB — POCT URINALYSIS DIPSTICK, ED / UC
Bilirubin Urine: NEGATIVE
Glucose, UA: NEGATIVE mg/dL
Hgb urine dipstick: NEGATIVE
Ketones, ur: NEGATIVE mg/dL
Leukocytes,Ua: NEGATIVE
Nitrite: NEGATIVE
Protein, ur: NEGATIVE mg/dL
Specific Gravity, Urine: >=1.03 (ref 1.005–1.030)
Urobilinogen, UA: 0.2 mg/dL (ref 0.0–1.0)
pH: 5.5 (ref 5.0–8.0)

## 2020-08-25 NOTE — Telephone Encounter (Signed)
Try to call patient regarding her labs.  I want her to go to the emergency department.  And left a message on her answering machine.

## 2020-08-25 NOTE — Discharge Instructions (Signed)
I am drawing some basic lab work.  We will call you with any abnormal results.  Recommend compression stockings and elevate in the legs every evening after work If your blood work is normal we may put you on a few days worth of Lasix to see if this helps draw some of fluid off. Otherwise I would follow with primary care for further management

## 2020-08-25 NOTE — ED Triage Notes (Signed)
Pt c/o leg and ankle swelling onset last night. She states this is a chronic problem, she states she usually elevates them and the swelling goes down.

## 2020-08-26 ENCOUNTER — Telehealth (HOSPITAL_COMMUNITY): Payer: Self-pay | Admitting: Family Medicine

## 2020-08-26 NOTE — ED Provider Notes (Signed)
[facility]    CSN: [id number] Arrival date & time: 08/25/20  1335      History   Chief Complaint Chief Complaint  Patient presents with  . Leg Swelling    HPI Dawn Lyons is a 44 y.o. female.   Patient is a 44 year old female with past medical history of endometriosis, fibroid uterus, PID, trichomoniasis.  She presents today with bilateral lower extremity swelling.  This started last night.  Typically this is something that is chronic for her and usually resolved by elevating the legs.  Reports elevated legs yesterday evening but symptoms did not resolve and have worsened.  Mild generalized tenderness without erythema, bruising.  No injuries.  No rash Denies any chest pain, shortness of breath, weakness, dizziness, lightheadedness.  Just finished her menstrual cycle.  Stands on feet 10 hours a day at work     Past Medical History:  Diagnosis Date  . Endometriosis   . Fibroid uterus   . PID (pelvic inflammatory disease)   . Trichimoniasis     There are no problems to display for this patient.   Past Surgical History:  Procedure Laterality Date  . LAPAROSCOPIC ENDOMETRIOSIS FULGURATION      OB History    Gravida  0   Para      Term      Preterm      AB      Living        SAB      TAB      Ectopic      Multiple      Live Births               Home Medications    Prior to Admission medications   Medication Sig Start Date End Date Taking? Authorizing Provider  fluconazole (DIFLUCAN) 200 MG tablet Take once today. Take second pill at completion of antibiotics. 09/18/19   Zigmund Gottron, NP  naproxen (NAPROSYN) 500 MG tablet Take 1 tablet (500 mg total) by mouth 2 (two) times daily. 09/23/19   Wieters, Hallie C, PA-C  Naproxen Sodium (ALEVE) 220 MG CAPS Take 440 mg by mouth.    [provider]  promethazine (PHENERGAN) 25 MG tablet Take 1 tablet (25 mg total) by mouth every 6 (six) hours as needed for nausea or vomiting.  5/72/62   Defelice, Jeanett Schlein, NP  SUMAtriptan (IMITREX) 100 MG tablet Take 1 tablet (100 mg total) by mouth every 2 (two) hours as needed for migraine. May repeat in 2 hours if headache persists or recurs. 10/24/15   Lysbeth Penner, FNP    Family History Family History  Problem Relation Age of Onset  . Hypertension Mother   . Hypertension Sister   . Diabetes Maternal Grandmother     Social History Social History   Tobacco Use  . Smoking status: Never Smoker  . Smokeless tobacco: Never Used  Substance Use Topics  . Alcohol use: No  . Drug use: No     Allergies   Patient has no known allergies.   Review of Systems Review of Systems   Physical Exam Triage Vital Signs ED Triage Vitals  Enc Vitals Group     BP 08/25/20 1419 121/82     Pulse Rate 08/25/20 1419 86     Resp 08/25/20 1419 15     Temp 08/25/20 1419 98.7 F (37.1 C)     Temp Source 08/25/20 1419 Oral     SpO2 08/25/20 1419 100 %  Weight --      Height --      Head Circumference --      Peak Flow --      Pain Score 08/25/20 1416 8     Pain Loc --      Pain Edu? --      Excl. in [place]? --    No data found.  Updated Vital Signs BP 121/82 (BP Location: Right Arm)   Pulse 86   Temp 98.7 F (37.1 C) (Oral)   Resp 15   LMP 08/21/2020   SpO2 100%   Visual Acuity Right Eye Distance:   Left Eye Distance:   Bilateral Distance:    Right Eye Near:   Left Eye Near:    Bilateral Near:     Physical Exam Vitals and nursing note reviewed.  Constitutional:      General: She is not in acute distress.    Appearance: Normal appearance. She is not ill-appearing, toxic-appearing or diaphoretic.  HENT:     Head: Normocephalic.     Nose: Nose normal.  Eyes:     Conjunctiva/sclera: Conjunctivae normal.  Pulmonary:     Effort: Pulmonary effort is normal.  Musculoskeletal:        General: Normal range of motion.     Cervical back: Normal range of motion.     Right lower leg: Edema present.     Left  lower leg: Edema present.     Comments: 2+ pitting edema to bilateral lower extremity Normal temperature and color.  2+ pedal pulse bilaterally  Skin:    General: Skin is warm and dry.     Findings: No rash.  Neurological:     Mental Status: She is alert.  Psychiatric:        Mood and Affect: Mood normal.      UC Treatments / Results  Labs (all labs ordered are listed, but only abnormal results are displayed) Labs Reviewed  CBC - Abnormal; Notable for the following components:      Result Value   RBC 3.64 (*)    Hemoglobin 6.1 (*)    HCT 23.2 (*)    MCV 63.7 (*)    MCH 16.8 (*)    MCHC 26.3 (*)    RDW 20.2 (*)    All other components within normal limits  COMPREHENSIVE METABOLIC PANEL - Abnormal; Notable for the following components:   Potassium 3.4 (*)    Creatinine, Ser 1.01 (*)    Total Bilirubin 0.2 (*)    All other components within normal limits  POCT URINALYSIS DIPSTICK, ED / UC    EKG   Radiology No results found.  Procedures Procedures (including critical care time)  Medications Ordered in UC Medications - No data to display  Initial Impression / Assessment and Plan / UC Course  I have reviewed the triage vital signs and the nursing notes.  Pertinent labs & imaging results that were available during my care of the patient were reviewed by me and considered in my medical decision making (see chart for details).     Bilateral lower extremity edema.  Most likely dependent edema She is not having any other concerning signs or symptoms.  Vital signs are stable.  We will draw some basic lab work.  Recommend compression stockings and elevating legs every evening after work. May give patient a few days of Lasix if lab values are normal Contact him for primary care for follow-up.  Final Clinical Impressions(s) / UC  Diagnoses   Final diagnoses:  Bilateral lower extremity edema     Discharge Instructions     I am drawing some basic lab work.  We will  call you with any abnormal results.  Recommend compression stockings and elevate in the legs every evening after work If your blood work is normal we may put you on a few days worth of Lasix to see if this helps draw some of fluid off. Otherwise I would follow with primary care for further management    ED Prescriptions    None     PDMP not reviewed this encounter.   Loura Halt A, NP 08/26/20 [phone]

## 2020-08-26 NOTE — Telephone Encounter (Signed)
Spoke with patient over the phone about low hemoglobin level and recommended go to the ER.  Patient agree

## 2021-03-14 ENCOUNTER — Encounter (HOSPITAL_COMMUNITY): Payer: Self-pay

## 2021-03-14 ENCOUNTER — Ambulatory Visit (HOSPITAL_COMMUNITY)
Admission: EM | Admit: 2021-03-14 | Discharge: 2021-03-14 | Disposition: A | Discharge status: Home / Self Care | Payer: PRIVATE HEALTH INSURANCE | Source: Ambulatory Visit

## 2021-03-14 ENCOUNTER — Other Ambulatory Visit: Payer: Self-pay

## 2021-03-14 VITALS — BP 134/79 | HR 88 | Temp 98.6°F | Resp 18

## 2021-03-14 DIAGNOSIS — M7989 Other specified soft tissue disorders: Secondary | ICD-10-CM

## 2021-03-14 DIAGNOSIS — I83813 Varicose veins of bilateral lower extremities with pain: Secondary | ICD-10-CM

## 2021-03-14 NOTE — Discharge Instructions (Signed)
For diabetes or elevated blood sugar, please make sure you are limiting and avoiding starchy, carbohydrate foods like pasta, breads, sweet breads, pastry, rice, potatoes, desserts. These foods can elevate your blood sugar. Also, limit and avoid drinks that contain a lot of sugar such as sodas, sweet teas, fruit juices.  Drinking plain water will be much more helpful, try 64 ounces of water daily.  It is okay to flavor your water naturally by cutting cucumber, lemon, mint or lime, placing it in a picture with water and drinking it over a period of 24-48 hours as long as it remains refrigerated.  For elevated blood pressure, make sure you are monitoring salt in your diet.  Do not eat restaurant foods and limit processed foods at home. I highly recommend you prepare and cook your own foods at home.  Processed foods include things like frozen meals, pre-seasoned meats and dinners, deli meats, canned foods as these foods contain a high amount of sodium/salt.  Make sure you are paying attention to sodium labels on foods you buy at the grocery store. Buy your spices separately such as garlic powder, onion powder, cumin, cayenne, parsley flakes so that you can avoid seasonings that contain salt. However, salt-free seasonings are available and can be used, an example is Mrs. Dash and includes a lot of different mixtures that do not contain salt.  Lastly, when cooking using oils that are healthier for you is important. This includes olive oil, avocado oil, canola oil. We have discussed a lot of foods to avoid but below is a list of foods that can be very healthy to use in your diet whether it is for diabetes, cholesterol, high blood pressure, or in general healthy eating.  Salads - kale, spinach, cabbage, spring mix, arugula Fruits - avocadoes, berries (blueberries, raspberries, blackberries), apples, oranges, pomegranate, grapefruit, kiwi, apricot Vegetables - asparagus, cauliflower, broccoli, green beans, brussel  sprouts, bell peppers, beets; stay away from or limit starchy vegetables like potatoes, carrots, peas Other general foods - kidney beans, egg whites, almonds, walnuts, sunflower seeds, pumpkin seeds, fat free yogurt, almond milk, flax seeds, quinoa, oats  Meat - It is better to eat lean meats and limit your red meat including pork to once a week.  Wild caught fish, chicken breast are good options as they tend to be leaner sources of good protein. Still be mindful of the sodium labels for the meats you buy.  DO NOT EAT ANY FOODS ON THIS LIST THAT YOU ARE ALLERGIC TO. For more specific needs, I highly recommend consulting a dietician or nutritionist but this can definitely be a good starting point.

## 2021-03-14 NOTE — ED Triage Notes (Signed)
Pt c/o bilateral leg swelling while standing. She states her legs start throbbing. Pt states she has elevated her legs for relief.

## 2021-03-14 NOTE — ED Provider Notes (Addendum)
[place]   MRN: [id number] DOB: Apr 25, 1976  Subjective:   Dawn Lyons is a 45 y.o. female presenting for several week history of intermittent persistent bilateral leg swelling lateral leg pain.  Patient states that she started to wear compression socks this was helping but the swelling then builds up above the compression socks and moves proximally.  Denies any redness, warmth, history of clotting disorders.  Patient works long hours, stands the entirety of her shift.  Works at a [employer], eats there regularly.  Denies chest pain, shortness of breath, abdominal pain.  No hematuria.  Denies history of high blood pressure.  Patient states he used to exercise regularly and eat very healthily but since the pandemic hit she was forced to go to a different line of work and her symptoms started thereafter.  She also stopped eating healthily.  No current facility-administered medications for this encounter.  Current Outpatient Medications:  .  fluconazole (DIFLUCAN) 200 MG tablet, Take once today. Take second pill at completion of antibiotics., Disp: 2 tablet, Rfl: 0 .  naproxen (NAPROSYN) 500 MG tablet, Take 1 tablet (500 mg total) by mouth 2 (two) times daily., Disp: 30 tablet, Rfl: 0 .  Naproxen Sodium (ALEVE) 220 MG CAPS, Take 440 mg by mouth., Disp: , Rfl:  .  promethazine (PHENERGAN) 25 MG tablet, Take 1 tablet (25 mg total) by mouth every 6 (six) hours as needed for nausea or vomiting., Disp: 20 tablet, Rfl: 0 .  SUMAtriptan (IMITREX) 100 MG tablet, Take 1 tablet (100 mg total) by mouth every 2 (two) hours as needed for migraine. May repeat in 2 hours if headache persists or recurs., Disp: 10 tablet, Rfl: 0   No Known Allergies  Past Medical History:  Diagnosis Date  . Endometriosis   . Fibroid uterus   . PID (pelvic inflammatory disease)   . Trichimoniasis      Past Surgical History:  Procedure Laterality Date  . LAPAROSCOPIC ENDOMETRIOSIS FULGURATION       Family History  Problem Relation Age of Onset  . Hypertension Mother   . Hypertension Sister   . Diabetes Maternal Grandmother     Social History   Tobacco Use  . Smoking status: Never Smoker  . Smokeless tobacco: Never Used  Substance Use Topics  . Alcohol use: No  . Drug use: No    ROS   Objective:   Vitals: BP 134/79 (BP Location: Right Arm)   Pulse 88   Temp 98.6 F (37 C)   Resp 18   LMP 03/07/2021 (Approximate)   SpO2 100%   Physical Exam Constitutional:      General: She is not in acute distress.    Appearance: Normal appearance. She is well-developed. She is not ill-appearing, toxic-appearing or diaphoretic.  HENT:     Head: Normocephalic and atraumatic.     Right Ear: External ear normal.     Left Ear: External ear normal.     Nose: Nose normal.     Mouth/Throat:     Mouth: Mucous membranes are moist.     Pharynx: Oropharynx is clear.  Eyes:     General: No scleral icterus.       Right eye: No discharge.        Left eye: No discharge.     Extraocular Movements: Extraocular movements intact.     Conjunctiva/sclera: Conjunctivae normal.     Pupils: Pupils are equal, round, and reactive to light.  Cardiovascular:     Rate and Rhythm: Normal rate and regular rhythm.     Pulses: Normal pulses.     Heart sounds: Normal heart sounds. No murmur heard. No friction rub. No gallop.   Pulmonary:     Effort: Pulmonary effort is normal. No respiratory distress.     Breath sounds: Normal breath sounds. No stridor. No wheezing, rhonchi or rales.  Musculoskeletal:     Right lower leg: No swelling, deformity, lacerations, tenderness or bony tenderness. No edema.     Left lower leg: No swelling, deformity, lacerations, tenderness or bony tenderness. No edema.     Comments: Bilateral small varicose veins of the lateral side of the right lower leg and anterior medial side of the left lower leg.  Negative Homans' sign.  Skin:    General: Skin is warm and dry.      Findings: No rash.  Neurological:     General: No focal deficit present.     Mental Status: She is alert and oriented to person, place, and time.  Psychiatric:        Mood and Affect: Mood normal.        Behavior: Behavior normal.        Thought Content: Thought content normal.        Judgment: Judgment normal.      Assessment and Plan :   PDMP not reviewed this encounter.  1. Varicose veins of leg with pain, bilateral   2. Leg swelling     Counseled patient on lifestyle, dietary modifications and encouraged her to go back to her previous healthy diet, exercise routine.  Recommended she wear compression socks at her thigh-high. Counseled patient on potential for adverse effects with medications prescribed/recommended today, ER and return-to-clinic precautions discussed, patient verbalized understanding.   Jaynee Eagles, [place] 03/14/21 [phone]

## 2021-08-07 ENCOUNTER — Ambulatory Visit (HOSPITAL_COMMUNITY)
Admission: EM | Admit: 2021-08-07 | Discharge: 2021-08-07 | Disposition: A | Discharge status: Home / Self Care | Payer: Self-pay | Attending: Physician Assistant | Admitting: Physician Assistant

## 2021-08-07 ENCOUNTER — Other Ambulatory Visit: Payer: Self-pay

## 2021-08-07 ENCOUNTER — Encounter (HOSPITAL_COMMUNITY): Payer: Self-pay

## 2021-08-07 DIAGNOSIS — Z28310 Unvaccinated for covid-19: Secondary | ICD-10-CM | POA: Insufficient documentation

## 2021-08-07 DIAGNOSIS — J069 Acute upper respiratory infection, unspecified: Secondary | ICD-10-CM

## 2021-08-07 DIAGNOSIS — J029 Acute pharyngitis, unspecified: Secondary | ICD-10-CM

## 2021-08-07 DIAGNOSIS — Z791 Long term (current) use of non-steroidal anti-inflammatories (NSAID): Secondary | ICD-10-CM | POA: Insufficient documentation

## 2021-08-07 DIAGNOSIS — U071 COVID-19: Secondary | ICD-10-CM | POA: Insufficient documentation

## 2021-08-07 DIAGNOSIS — R059 Cough, unspecified: Secondary | ICD-10-CM

## 2021-08-07 LAB — SARS CORONAVIRUS 2 (TAT 6-24 HRS): SARS Coronavirus 2: POSITIVE — AB

## 2021-08-07 MED ORDER — PROMETHAZINE-DM 6.25-15 MG/5ML PO SYRP: 5.0000 mL | ORAL_SOLUTION | Freq: Three times a day (TID) | ORAL | 0 refills | Status: DC | PRN

## 2021-08-07 NOTE — Discharge Instructions (Addendum)
You are tested for COVID-19 today.  We will contact you if this is positive.  Please stay at home until you get results.  When you return to work will depend on these results (see work excuse note).  I have called in a cough medicine and is Promethazine DM.  This make you sleepy do not drive or drink alcohol taking it.  Use over-the-counter medications including Mucinex and Flonase for additional symptom relief.  Make sure you are drinking plenty of fluid.  If you have any worsening symptoms including high fever, chest pain, shortness of breath, nausea/vomiting interfering with eating and drinking you need to be reevaluated immediately.

## 2021-08-07 NOTE — ED Provider Notes (Signed)
[facility] [facility]    CSN: [id number] Arrival date & time: 08/07/21  0805      History   Chief Complaint Chief Complaint  Patient presents with   Cough   Sore Throat    HPI Dawn Lyons is a 45 y.o. female.   Patient presents today with a 2-day history of URI symptoms.  Reports decreased appetite, cough, sore throat, headache, fatigue, malaise.  Denies any nausea, vomiting, chest pain, shortness of breath.  She has not tried any over-the-counter medications for symptom management.  She has not had influenza or COVID-19 vaccinations.  Denies any recent antibiotic use.  Denies history of allergies, asthma, COPD, smoking.  Denies history of diabetes or other chronic medical conditions.  She does report sick contacts at work who had COVID-19 symptoms including loss of sense of taste and smell.  She has not had COVID-19 in the past.  She has no concern for pregnancy.  She is eating and drinking normally despite sore throat symptoms.   Past Medical History:  Diagnosis Date   Endometriosis    Fibroid uterus    PID (pelvic inflammatory disease)    Trichimoniasis     There are no problems to display for this patient.   Past Surgical History:  Procedure Laterality Date   LAPAROSCOPIC ENDOMETRIOSIS FULGURATION      OB History     Gravida  0   Para      Term      Preterm      AB      Living         SAB      IAB      Ectopic      Multiple      Live Births               Home Medications    Prior to Admission medications   Medication Sig Start Date End Date Taking? Authorizing Provider  promethazine-dextromethorphan (PROMETHAZINE-DM) 6.25-15 MG/5ML syrup Take 5 mLs by mouth 3 (three) times daily as needed for cough. 08/07/21  Yes Antania Hoefling K, PA-C  fluconazole (DIFLUCAN) 200 MG tablet Take once today. Take second pill at completion of antibiotics. 09/18/19   Zigmund Gottron, NP  naproxen (NAPROSYN) 500 MG tablet Take 1 tablet (500 mg total) by mouth  2 (two) times daily. 09/23/19   Wieters, Hallie C, PA-C  Naproxen Sodium 220 MG CAPS Take 440 mg by mouth.    [provider]  SUMAtriptan (IMITREX) 100 MG tablet Take 1 tablet (100 mg total) by mouth every 2 (two) hours as needed for migraine. May repeat in 2 hours if headache persists or recurs. 10/24/15   Lysbeth Penner, FNP    Family History Family History  Problem Relation Age of Onset   Hypertension Mother    Hypertension Sister    Diabetes Maternal Grandmother     Social History Social History   Tobacco Use   Smoking status: Never   Smokeless tobacco: Never  Vaping Use   Vaping Use: Never used  Substance Use Topics   Alcohol use: No   Drug use: No     Allergies   Patient has no known allergies.   Review of Systems Review of Systems  Constitutional:  Positive for activity change, appetite change and fatigue. Negative for fever.  HENT:  Positive for congestion, sinus pressure and sore throat. Negative for sneezing.   Respiratory:  Positive for cough. Negative for shortness of breath.  Cardiovascular:  Negative for chest pain.  Gastrointestinal:  Negative for abdominal pain, diarrhea, nausea and vomiting.  Musculoskeletal:  Negative for arthralgias and myalgias.  Neurological:  Positive for headaches. Negative for dizziness and light-headedness.    Physical Exam Triage Vital Signs ED Triage Vitals  Enc Vitals Group     BP 08/07/21 0846 133/85     Pulse Rate 08/07/21 0846 85     Resp 08/07/21 0846 18     Temp 08/07/21 0846 99 F (37.2 C)     Temp Source 08/07/21 0846 Oral     SpO2 08/07/21 0846 100 %     Weight --      Height --      Head Circumference --      Peak Flow --      Pain Score 08/07/21 0844 6     Pain Loc --      Pain Edu? --      Excl. in [place]? --    No data found.  Updated Vital Signs BP 133/85 (BP Location: Right Arm)   Pulse 85   Temp 99 F (37.2 C) (Oral)   Resp 18   SpO2 100%   Visual Acuity Right Eye Distance:    Left Eye Distance:   Bilateral Distance:    Right Eye Near:   Left Eye Near:    Bilateral Near:     Physical Exam Vitals reviewed.  Constitutional:      General: She is awake. She is not in acute distress.    Appearance: Normal appearance. She is normal weight. She is not ill-appearing.     Comments: Very pleasant female appears stated age no acute distress  HENT:     Head: Normocephalic and atraumatic.     Right Ear: Tympanic membrane, ear canal and external ear normal. Tympanic membrane is not erythematous or bulging.     Left Ear: Tympanic membrane, ear canal and external ear normal. Tympanic membrane is not erythematous or bulging.     Nose:     Right Sinus: No maxillary sinus tenderness or frontal sinus tenderness.     Left Sinus: No maxillary sinus tenderness or frontal sinus tenderness.     Mouth/Throat:     Pharynx: Uvula midline. Posterior oropharyngeal erythema present. No oropharyngeal exudate.  Cardiovascular:     Rate and Rhythm: Normal rate and regular rhythm.     Heart sounds: Normal heart sounds, S1 normal and S2 normal. No murmur heard. Pulmonary:     Effort: Pulmonary effort is normal.     Breath sounds: Normal breath sounds. No wheezing, rhonchi or rales.     Comments: Clear to auscultation bilaterally Lymphadenopathy:     Head:     Right side of head: No submental, submandibular or tonsillar adenopathy.     Left side of head: No submental, submandibular or tonsillar adenopathy.     Cervical: No cervical adenopathy.  Psychiatric:        Behavior: Behavior is cooperative.     UC Treatments / Results  Labs (all labs ordered are listed, but only abnormal results are displayed) Labs Reviewed  SARS CORONAVIRUS 2 (TAT 6-24 HRS)    EKG   Radiology No results found.  Procedures Procedures (including critical care time)  Medications Ordered in UC Medications - No data to display  Initial Impression / Assessment and Plan / UC Course  I have  reviewed the triage vital signs and the nursing notes.  Pertinent labs & imaging results  that were available during my care of the patient were reviewed by me and considered in my medical decision making (see chart for details).    Suspect viral etiology given short duration of symptoms.  No indication for flu testing given patient has been symptomatic for more than 48 hours.  COVID-19 test pending.  Patient was instructed to remain at home until her results are obtained.  We discussed current CDC return to work guidelines and she was given a work excuse note with current information.  She was prescribed meclizine DM with instruction not to drive drink alcohol while taking this medication.  Recommended she use over-the-counter medications including Flonase and Mucinex for additional symptom relief.  Discussed alarm symptoms that warrant emergent evaluation.  Strict return precautions given to which patient expressed understanding.  Final Clinical Impressions(s) / UC Diagnoses   Final diagnoses:  Upper respiratory tract infection, unspecified type  Cough  Sore throat     Discharge Instructions      You are tested for COVID-19 today.  We will contact you if this is positive.  Please stay at home until you get results.  When you return to work will depend on these results (see work excuse note).  I have called in a cough medicine and is Promethazine DM.  This make you sleepy do not drive or drink alcohol taking it.  Use over-the-counter medications including Mucinex and Flonase for additional symptom relief.  Make sure you are drinking plenty of fluid.  If you have any worsening symptoms including high fever, chest pain, shortness of breath, nausea/vomiting interfering with eating and drinking you need to be reevaluated immediately.     ED Prescriptions     Medication Sig Dispense Auth. Provider   promethazine-dextromethorphan (PROMETHAZINE-DM) 6.25-15 MG/5ML syrup Take 5 mLs by mouth 3  (three) times daily as needed for cough. 118 mL Brace Welte K, PA-C      PDMP not reviewed this encounter.   Terrilee Croak, PA-C 08/07/21 [phone]

## 2021-08-07 NOTE — ED Triage Notes (Signed)
Pt reports cough and sore throat x 2 days.

## 2021-08-14 ENCOUNTER — Encounter (HOSPITAL_COMMUNITY): Payer: Self-pay

## 2021-08-14 ENCOUNTER — Ambulatory Visit (HOSPITAL_COMMUNITY)
Admission: EM | Admit: 2021-08-14 | Discharge: 2021-08-14 | Disposition: A | Discharge status: Home / Self Care | Payer: Self-pay

## 2021-08-14 ENCOUNTER — Other Ambulatory Visit: Payer: Self-pay

## 2021-08-14 DIAGNOSIS — U071 COVID-19: Secondary | ICD-10-CM

## 2021-08-14 DIAGNOSIS — R42 Dizziness and giddiness: Secondary | ICD-10-CM

## 2021-08-14 NOTE — ED Triage Notes (Signed)
Pt states today was her first day back at work (post covid) and she got lightheaded and dizzy when while standing. Pt denies vision changes, but has a headache.  Started: today

## 2021-08-14 NOTE — Discharge Instructions (Addendum)
You can take Tylenol and/or Ibuprofen as needed for pain relief and fever reduction.    Make sure you are drinking plenty of fluids and rest.   You can continue to take the cough medication as previously prescribed.    Return or go to the Emergency Department if symptoms worsen or do not improve in the next few days.

## 2021-08-14 NOTE — ED Provider Notes (Signed)
[name]    CSN: [id number] Arrival date & time: 08/14/21  [id number]      History   Chief Complaint Chief Complaint  Patient presents with   Dizziness    HPI Dawn Lyons is a 45 y.o. female.   Patient here for evaluation of dizziness and headache that occurred today while at work.  Reports today was first day back at work after being diagnosed with COVID.  Quarantine was up on Friday.  Denies any vision changes, nausea, vomiting, or loss of consciousness.  Denies any history of DM.  Reports eating and drinking adequately.  Reports taking cough syrup as prescribed but denies taking it this am.  Denies any trauma, injury, or other precipitating event.  Denies any specific alleviating or aggravating factors.  Denies any fevers, chest pain, shortness of breath, numbness, tingling, weakness, or abdominal pain.    The history is provided by the patient.  Dizziness Associated symptoms: headaches    Past Medical History:  Diagnosis Date   Endometriosis    Fibroid uterus    PID (pelvic inflammatory disease)    Trichimoniasis     There are no problems to display for this patient.   Past Surgical History:  Procedure Laterality Date   LAPAROSCOPIC ENDOMETRIOSIS FULGURATION      OB History     Gravida  0   Para      Term      Preterm      AB      Living         SAB      IAB      Ectopic      Multiple      Live Births               Home Medications    Prior to Admission medications   Medication Sig Start Date End Date Taking? Authorizing Provider  SUMAtriptan (IMITREX) 100 MG tablet Take 1 tablet (100 mg total) by mouth every 2 (two) hours as needed for migraine. May repeat in 2 hours if headache persists or recurs. 10/24/15  Yes Lysbeth Penner, FNP  fluconazole (DIFLUCAN) 200 MG tablet Take once today. Take second pill at completion of antibiotics. 09/18/19   Zigmund Gottron, NP  naproxen (NAPROSYN) 500 MG tablet Take 1 tablet (500 mg  total) by mouth 2 (two) times daily. 09/23/19   Wieters, Hallie C, PA-C  Naproxen Sodium 220 MG CAPS Take 440 mg by mouth.    [provider]  promethazine-dextromethorphan (PROMETHAZINE-DM) 6.25-15 MG/5ML syrup Take 5 mLs by mouth 3 (three) times daily as needed for cough. 08/07/21   Raspet, Derry Skill, PA-C    Family History Family History  Problem Relation Age of Onset   Hypertension Mother    Hypertension Sister    Diabetes Maternal Grandmother     Social History Social History   Tobacco Use   Smoking status: Never   Smokeless tobacco: Never  Vaping Use   Vaping Use: Never used  Substance Use Topics   Alcohol use: No   Drug use: No     Allergies   Patient has no known allergies.   Review of Systems Review of Systems  Neurological:  Positive for dizziness and headaches.  All other systems reviewed and are negative.   Physical Exam Triage Vital Signs ED Triage Vitals  Enc Vitals Group     BP --      Pulse Rate 08/14/21 0846 76  Resp 08/14/21 0846 18     Temp 08/14/21 0846 97.8 F (36.6 C)     Temp Source 08/14/21 0846 Oral     SpO2 08/14/21 0846 100 %     Weight --      Height --      Head Circumference --      Peak Flow --      Pain Score 08/14/21 0844 9     Pain Loc --      Pain Edu? --      Excl. in [place]? --    Orthostatic VS for the past 24 hrs:  BP- Lying Pulse- Lying BP- Standing at 0 minutes Pulse- Standing at 0 minutes  08/14/21 0854 112/73 76 110/71 80    Updated Vital Signs Pulse 76   Temp 97.8 F (36.6 C) (Oral)   Resp 18   LMP 08/07/2021 (Approximate)   SpO2 100%   Visual Acuity Right Eye Distance:   Left Eye Distance:   Bilateral Distance:    Right Eye Near:   Left Eye Near:    Bilateral Near:     Physical Exam Vitals and nursing note reviewed.  Constitutional:      General: She is not in acute distress.    Appearance: Normal appearance. She is not ill-appearing, toxic-appearing or diaphoretic.  HENT:     Head:  Normocephalic and atraumatic.     Right Ear: Tympanic membrane, ear canal and external ear normal.     Left Ear: Tympanic membrane, ear canal and external ear normal.  Eyes:     Conjunctiva/sclera: Conjunctivae normal.  Cardiovascular:     Rate and Rhythm: Normal rate.     Pulses: Normal pulses.     Heart sounds: Normal heart sounds.  Pulmonary:     Effort: Pulmonary effort is normal.     Breath sounds: Normal breath sounds.  Abdominal:     General: Abdomen is flat.  Musculoskeletal:        General: Normal range of motion.     Cervical back: Normal range of motion.  Skin:    General: Skin is warm and dry.  Neurological:     General: No focal deficit present.     Mental Status: She is alert and oriented to person, place, and time.     GCS: GCS eye subscore is 4. GCS verbal subscore is 5. GCS motor subscore is 6.     Cranial Nerves: Cranial nerves are intact.     Motor: Motor function is intact.     Coordination: Coordination is intact.     Gait: Gait is intact.  Psychiatric:        Mood and Affect: Mood normal.     UC Treatments / Results  Labs (all labs ordered are listed, but only abnormal results are displayed) Labs Reviewed - No data to display  EKG   Radiology No results found.  Procedures Procedures (including critical care time)  Medications Ordered in UC Medications - No data to display  Initial Impression / Assessment and Plan / UC Course  I have reviewed the triage vital signs and the nursing notes.  Pertinent labs & imaging results that were available during my care of the patient were reviewed by me and considered in my medical decision making (see chart for details).    Assessment negative for red flags or concerns.  Dizziness and headache likely related to recent COVID diagnosis.  May take Tylenol and/or Ibuprofen as needed.  Encouraged fluids  and rest.  Instructed to not take promethazine DM prior to driving as it can cause drowsiness but may take  it as previously prescribed.  Work note given to patient.  Follow up as needed.  Final Clinical Impressions(s) / UC Diagnoses   Final diagnoses:  Dizziness  COVID-19     Discharge Instructions      You can take Tylenol and/or Ibuprofen as needed for pain relief and fever reduction.    Make sure you are drinking plenty of fluids and rest.   You can continue to take the cough medication as previously prescribed.    Return or go to the Emergency Department if symptoms worsen or do not improve in the next few days.      ED Prescriptions   None    PDMP not reviewed this encounter.   Pearson Forster, NP 08/14/21 [phone]

## 2021-09-19 ENCOUNTER — Encounter (HOSPITAL_COMMUNITY): Payer: Self-pay | Admitting: Emergency Medicine

## 2021-09-19 ENCOUNTER — Other Ambulatory Visit: Payer: Self-pay

## 2021-09-19 ENCOUNTER — Ambulatory Visit (HOSPITAL_COMMUNITY)
Admission: EM | Admit: 2021-09-19 | Discharge: 2021-09-19 | Disposition: A | Discharge status: Home / Self Care | Payer: Self-pay | Attending: Student | Admitting: Student

## 2021-09-19 DIAGNOSIS — A084 Viral intestinal infection, unspecified: Secondary | ICD-10-CM

## 2021-09-19 MED ORDER — ONDANSETRON 8 MG PO TBDP: 8.0000 mg | ORAL_TABLET | Freq: Three times a day (TID) | ORAL | 0 refills | Status: DC | PRN

## 2021-09-19 MED ORDER — FAMOTIDINE 20 MG PO TABS: 20.0000 mg | ORAL_TABLET | Freq: Two times a day (BID) | ORAL | 2 refills | Status: DC

## 2021-09-19 MED ORDER — LOPERAMIDE HCL 2 MG PO CAPS: 2.0000 mg | ORAL_CAPSULE | Freq: Four times a day (QID) | ORAL | 0 refills | Status: DC | PRN

## 2021-09-19 NOTE — ED Provider Notes (Signed)
[place]    CSN: [id number] Arrival date & time: 09/19/21  6295      History   Chief Complaint Chief Complaint  Patient presents with   Nausea   Abdominal Pain    HPI Dawn Lyons is a 45 y.o. female presenting with viral gastroenteritis.  Medical history PID, trichomoniasis.  Endorses about 1 day of nausea without vomiting and frequent watery diarrhea.  Generalized crampy abdominal pain worse in lower quadrants.  Denies vaginal or urinary symptoms, states she cannot be pregnant. Denies recent travel or sick contacts.   HPI  Past Medical History:  Diagnosis Date   Endometriosis    Fibroid uterus    PID (pelvic inflammatory disease)    Trichimoniasis     There are no problems to display for this patient.   Past Surgical History:  Procedure Laterality Date   LAPAROSCOPIC ENDOMETRIOSIS FULGURATION      OB History     Gravida  0   Para      Term      Preterm      AB      Living         SAB      IAB      Ectopic      Multiple      Live Births               Home Medications    Prior to Admission medications   Medication Sig Start Date End Date Taking? Authorizing Provider  famotidine (PEPCID) 20 MG tablet Take 1 tablet (20 mg total) by mouth 2 (two) times daily. Take 1 pill with breakfast and 1 pill with dinner for 1 week.  After this, you can take as needed up to twice daily with meals. 09/19/21  Yes Hazel Sams, PA-C  loperamide (IMODIUM) 2 MG capsule Take 1 capsule (2 mg total) by mouth 4 (four) times daily as needed for diarrhea or loose stools. 09/19/21  Yes Hazel Sams, PA-C  ondansetron (ZOFRAN ODT) 8 MG disintegrating tablet Take 1 tablet (8 mg total) by mouth every 8 (eight) hours as needed for nausea or vomiting. 09/19/21  Yes Hazel Sams, PA-C  fluconazole (DIFLUCAN) 200 MG tablet Take once today. Take second pill at completion of antibiotics. 09/18/19   Zigmund Gottron, NP  naproxen (NAPROSYN) 500 MG tablet  Take 1 tablet (500 mg total) by mouth 2 (two) times daily. 09/23/19   Wieters, Hallie C, PA-C  Naproxen Sodium 220 MG CAPS Take 440 mg by mouth.    [provider]  promethazine-dextromethorphan (PROMETHAZINE-DM) 6.25-15 MG/5ML syrup Take 5 mLs by mouth 3 (three) times daily as needed for cough. 08/07/21   Raspet, Derry Skill, PA-C  SUMAtriptan (IMITREX) 100 MG tablet Take 1 tablet (100 mg total) by mouth every 2 (two) hours as needed for migraine. May repeat in 2 hours if headache persists or recurs. 10/24/15   Lysbeth Penner, FNP    Family History Family History  Problem Relation Age of Onset   Hypertension Mother    Hypertension Sister    Diabetes Maternal Grandmother     Social History Social History   Tobacco Use   Smoking status: Never   Smokeless tobacco: Never  Vaping Use   Vaping Use: Never used  Substance Use Topics   Alcohol use: No   Drug use: No     Allergies   Patient has no known allergies.   Review of  Systems Review of Systems  Constitutional:  Negative for appetite change, chills, diaphoresis, fever and unexpected weight change.  HENT:  Negative for congestion, ear pain, sinus pressure, sinus pain, sneezing, sore throat and trouble swallowing.   Respiratory:  Negative for cough, chest tightness and shortness of breath.   Cardiovascular:  Negative for chest pain.  Gastrointestinal:  Positive for abdominal pain, diarrhea and nausea. Negative for abdominal distention, anal bleeding, blood in stool, constipation, rectal pain and vomiting.  Genitourinary:  Negative for dysuria, flank pain, frequency and urgency.  Musculoskeletal:  Negative for back pain and myalgias.  Neurological:  Negative for dizziness, light-headedness and headaches.  All other systems reviewed and are negative.   Physical Exam Triage Vital Signs ED Triage Vitals [09/19/21 1047]  Enc Vitals Group     BP 126/84     Pulse Rate 72     Resp 17     Temp 98.3 F (36.8 C)     Temp  Source Oral     SpO2 99 %     Weight      Height      Head Circumference      Peak Flow      Pain Score 9     Pain Loc      Pain Edu?      Excl. in [place]?    No data found.  Updated Vital Signs BP 126/84 (BP Location: Left Arm)   Pulse 72   Temp 98.3 F (36.8 C) (Oral)   Resp 17   LMP 09/11/2021   SpO2 99%   Visual Acuity Right Eye Distance:   Left Eye Distance:   Bilateral Distance:    Right Eye Near:   Left Eye Near:    Bilateral Near:     Physical Exam Vitals reviewed.  Constitutional:      General: She is not in acute distress.    Appearance: Normal appearance. She is not ill-appearing.  HENT:     Head: Normocephalic and atraumatic.     Mouth/Throat:     Mouth: Mucous membranes are moist.     Comments: Moist mucous membranes Eyes:     Extraocular Movements: Extraocular movements intact.     Pupils: Pupils are equal, round, and reactive to light.  Cardiovascular:     Rate and Rhythm: Normal rate and regular rhythm.     Heart sounds: Normal heart sounds.  Pulmonary:     Effort: Pulmonary effort is normal.     Breath sounds: Normal breath sounds. No wheezing, rhonchi or rales.  Abdominal:     General: Bowel sounds are increased. There is no distension.     Palpations: Abdomen is soft. There is no mass.     Tenderness: There is generalized abdominal tenderness. There is no right CVA tenderness, left CVA tenderness, guarding or rebound. Negative signs include Murphy's sign, Rovsing's sign and McBurney's sign.     Comments: BS increased throughout Generalized tenderness worse in lower quadrants No guarding or rebound  Skin:    General: Skin is warm.     Capillary Refill: Capillary refill takes less than 2 seconds.     Comments: Good skin turgor  Neurological:     General: No focal deficit present.     Mental Status: She is alert and oriented to person, place, and time.  Psychiatric:        Mood and Affect: Mood normal.        Behavior: Behavior normal.  UC Treatments / Results  Labs (all labs ordered are listed, but only abnormal results are displayed) Labs Reviewed - No data to display  EKG   Radiology No results found.  Procedures Procedures (including critical care time)  Medications Ordered in UC Medications - No data to display  Initial Impression / Assessment and Plan / UC Course  I have reviewed the triage vital signs and the nursing notes.  Pertinent labs & imaging results that were available during my care of the patient were reviewed by me and considered in my medical decision making (see chart for details).     This patient is a very pleasant 45 y.o. year old female presenting with gastroenteritis. Afebrile, nontachycardic, appears fairly well hydrated. States she is not pregnant or breastfeeding.  Zofran ODT, imodium, pepcid. Good hydration, BRAT diet.  ED return precautions discussed. Patient verbalizes understanding and agreement.   Final Clinical Impressions(s) / UC Diagnoses   Final diagnoses:  Viral gastroenteritis     Discharge Instructions      -Take the Zofran (ondansetron) up to 3 times daily for nausea and vomiting. -Pepcid (famotidine) taken with breakfast or dinner daily. Try for one week. You can increase to two pills daily if needed (breakfast and dinner). -If this medication is helping, you can continue it for longer.  -Take the Imodium (loperamide) up to 4 times daily for diarrhea. -Drink lots of fluids and eat a bland diet!     ED Prescriptions     Medication Sig Dispense Auth. Provider   ondansetron (ZOFRAN ODT) 8 MG disintegrating tablet Take 1 tablet (8 mg total) by mouth every 8 (eight) hours as needed for nausea or vomiting. 20 tablet Hazel Sams, PA-C   famotidine (PEPCID) 20 MG tablet Take 1 tablet (20 mg total) by mouth 2 (two) times daily. Take 1 pill with breakfast and 1 pill with dinner for 1 week.  After this, you can take as needed up to twice daily with  meals. 30 tablet Hazel Sams, PA-C   loperamide (IMODIUM) 2 MG capsule Take 1 capsule (2 mg total) by mouth 4 (four) times daily as needed for diarrhea or loose stools. 12 capsule Hazel Sams, PA-C      PDMP not reviewed this encounter.   Hazel Sams, PA-C 09/19/21 1125

## 2021-09-19 NOTE — ED Triage Notes (Signed)
Pt reports last night at work stomach started hurting got nauseated and had lots of BMs-denies loose/watery stools. Today still having abd pains with nausea.

## 2021-09-19 NOTE — Discharge Instructions (Addendum)
-  Take the Zofran (ondansetron) up to 3 times daily for nausea and vomiting. -Pepcid (famotidine) taken with breakfast or dinner daily. Try for one week. You can increase to two pills daily if needed (breakfast and dinner). -If this medication is helping, you can continue it for longer.  -Take the Imodium (loperamide) up to 4 times daily for diarrhea. -Drink lots of fluids and eat a bland diet!

## 2022-05-24 ENCOUNTER — Encounter (HOSPITAL_COMMUNITY): Payer: Self-pay

## 2022-05-24 ENCOUNTER — Ambulatory Visit (HOSPITAL_COMMUNITY)
Admission: EM | Admit: 2022-05-24 | Discharge: 2022-05-24 | Disposition: A | Discharge status: Home / Self Care | Payer: Self-pay | Attending: Emergency Medicine | Admitting: Emergency Medicine

## 2022-05-24 DIAGNOSIS — G43101 Migraine with aura, not intractable, with status migrainosus: Secondary | ICD-10-CM

## 2022-05-24 MED ORDER — SUMATRIPTAN SUCCINATE 50 MG PO TABS: 50.0000 mg | ORAL_TABLET | ORAL | 0 refills | Status: DC | PRN

## 2022-05-24 MED ORDER — ONDANSETRON 4 MG PO TBDP
ORAL_TABLET | ORAL | Status: AC
  Filled 2022-05-24: qty 1

## 2022-05-24 MED ORDER — KETOROLAC TROMETHAMINE 30 MG/ML IJ SOLN
INTRAMUSCULAR | Status: AC
  Filled 2022-05-24: qty 1

## 2022-05-24 MED ORDER — ONDANSETRON 4 MG PO TBDP
4.0000 mg | ORAL_TABLET | Freq: Once | ORAL | Status: AC
  Administered 2022-05-24: 4 mg via ORAL

## 2022-05-24 MED ORDER — KETOROLAC TROMETHAMINE 30 MG/ML IJ SOLN
30.0000 mg | Freq: Once | INTRAMUSCULAR | Status: AC
  Administered 2022-05-24: 30 mg via INTRAMUSCULAR

## 2022-05-24 NOTE — ED Triage Notes (Signed)
Pt reports migraine headache x 2-3 days.

## 2022-05-24 NOTE — Discharge Instructions (Signed)
The treatment today should relieve your headache. If it doesn't, try the prescription headache medicine. Don't take more aleve until tonight and only if needed.

## 2022-05-24 NOTE — ED Provider Notes (Signed)
[place]    CSN: [id number] Arrival date & time: 05/24/22  5993      History   Chief Complaint Chief Complaint  Patient presents with   Migraine    HPI Dawn Lyons is a 46 y.o. female.  Patient reports a migraine since 05/22/2019.  Pain is behind the left eye.  Patient has gotten migraines in this area in the past.  Typically gets headaches every few months, last 2 to 3 months ago.  Typically Aleve relieves her migraines but is not relieving at this time.  Reports nausea when first day of headache but none since.  Reports photophobia but no phonophobia.  Denies numbness, weakness, facial droop, fever, chills, sinus pressure, dental problems.   Migraine   Past Medical History:  Diagnosis Date   Endometriosis    Fibroid uterus    PID (pelvic inflammatory disease)    Trichimoniasis     There are no problems to display for this patient.   Past Surgical History:  Procedure Laterality Date   LAPAROSCOPIC ENDOMETRIOSIS FULGURATION      OB History     Gravida  0   Para      Term      Preterm      AB      Living         SAB      IAB      Ectopic      Multiple      Live Births               Home Medications    Prior to Admission medications   Medication Sig Start Date End Date Taking? Authorizing Provider  SUMAtriptan (IMITREX) 50 MG tablet Take 1 tablet (50 mg total) by mouth every 2 (two) hours as needed for migraine. May repeat in 2 hours if headache persists or recurs. 05/24/22  Yes Carvel Getting, NP  famotidine (PEPCID) 20 MG tablet Take 1 tablet (20 mg total) by mouth 2 (two) times daily. Take 1 pill with breakfast and 1 pill with dinner for 1 week.  After this, you can take as needed up to twice daily with meals. 09/19/21   Hazel Sams, PA-C  fluconazole (DIFLUCAN) 200 MG tablet Take once today. Take second pill at completion of antibiotics. 09/18/19   Zigmund Gottron, NP  loperamide (IMODIUM) 2 MG capsule Take 1 capsule  (2 mg total) by mouth 4 (four) times daily as needed for diarrhea or loose stools. 09/19/21   Hazel Sams, PA-C  naproxen (NAPROSYN) 500 MG tablet Take 1 tablet (500 mg total) by mouth 2 (two) times daily. 09/23/19   Wieters, Hallie C, PA-C  Naproxen Sodium 220 MG CAPS Take 440 mg by mouth.    [provider]  ondansetron (ZOFRAN ODT) 8 MG disintegrating tablet Take 1 tablet (8 mg total) by mouth every 8 (eight) hours as needed for nausea or vomiting. 09/19/21   Hazel Sams, PA-C  promethazine-dextromethorphan (PROMETHAZINE-DM) 6.25-15 MG/5ML syrup Take 5 mLs by mouth 3 (three) times daily as needed for cough. 08/07/21   Raspet, Derry Skill, PA-C    Family History Family History  Problem Relation Age of Onset   Hypertension Mother    Hypertension Sister    Diabetes Maternal Grandmother     Social History Social History   Tobacco Use   Smoking status: Never   Smokeless tobacco: Never  Vaping Use   Vaping Use: Never used  Substance Use Topics   Alcohol use: No   Drug use: No     Allergies   Patient has no known allergies.   Review of Systems Review of Systems   Physical Exam Triage Vital Signs ED Triage Vitals [05/24/22 0822]  Enc Vitals Group     BP 129/87     Pulse Rate 72     Resp 18     Temp 98.7 F (37.1 C)     Temp Source Oral     SpO2 98 %     Weight      Height      Head Circumference      Peak Flow      Pain Score      Pain Loc      Pain Edu?      Excl. in [place]?    No data found.  Updated Vital Signs BP 129/87 (BP Location: Left Arm)   Pulse 72   Temp 98.7 F (37.1 C) (Oral)   Resp 18   SpO2 98%   Visual Acuity Right Eye Distance:   Left Eye Distance:   Bilateral Distance:    Right Eye Near:   Left Eye Near:    Bilateral Near:     Physical Exam Constitutional:      Appearance: Normal appearance.     Comments: Appears uncomfortable.  Prefers lights being off in room.  HENT:     Right Ear: Tympanic membrane, ear canal and  external ear normal.     Left Ear: Tympanic membrane, ear canal and external ear normal.     Nose: No congestion.     Right Sinus: No maxillary sinus tenderness or frontal sinus tenderness.     Left Sinus: No maxillary sinus tenderness or frontal sinus tenderness.  Eyes:     Pupils: Pupils are equal, round, and reactive to light.  Cardiovascular:     Rate and Rhythm: Normal rate.  Pulmonary:     Effort: Pulmonary effort is normal.  Neurological:     Mental Status: She is alert and oriented to person, place, and time.     Cranial Nerves: No facial asymmetry.     Motor: No weakness.     Gait: Gait normal.     UC Treatments / Results  Labs (all labs ordered are listed, but only abnormal results are displayed) Labs Reviewed - No data to display  EKG   Radiology No results found.  Procedures Procedures (including critical care time)  Medications Ordered in UC Medications  ketorolac (TORADOL) 30 MG/ML injection 30 mg (has no administration in time range)  ondansetron (ZOFRAN-ODT) disintegrating tablet 4 mg (has no administration in time range)    Initial Impression / Assessment and Plan / UC Course  I have reviewed the triage vital signs and the nursing notes.  Pertinent labs & imaging results that were available during my care of the patient were reviewed by me and considered in my medical decision making (see chart for details).    Patient reports last taking Aleve at 3 AM.  Will give shot of Toradol and Zofran here in urgent care.  Prescribed sumatriptan for patient to have at home.  Final Clinical Impressions(s) / UC Diagnoses   Final diagnoses:  Migraine with aura and with status migrainosus, not intractable     Discharge Instructions      The treatment today should relieve your headache. If it doesn't, try the prescription headache medicine. Don't take more aleve until  tonight and only if needed.    ED Prescriptions     Medication Sig Dispense Auth.  Provider   SUMAtriptan (IMITREX) 50 MG tablet Take 1 tablet (50 mg total) by mouth every 2 (two) hours as needed for migraine. May repeat in 2 hours if headache persists or recurs. 10 tablet Carvel Getting, NP      PDMP not reviewed this encounter.   Carvel Getting, NP 05/24/22 [phone]

## 2022-08-22 ENCOUNTER — Other Ambulatory Visit: Payer: Self-pay

## 2022-08-22 ENCOUNTER — Encounter (HOSPITAL_COMMUNITY): Payer: Self-pay | Admitting: Emergency Medicine

## 2022-08-22 ENCOUNTER — Ambulatory Visit (HOSPITAL_COMMUNITY)
Admission: EM | Admit: 2022-08-22 | Discharge: 2022-08-22 | Disposition: A | Discharge status: Home / Self Care | Payer: Commercial Managed Care - HMO | Attending: Family Medicine | Admitting: Family Medicine

## 2022-08-22 ENCOUNTER — Encounter (HOSPITAL_COMMUNITY): Payer: Self-pay

## 2022-08-22 ENCOUNTER — Observation Stay (HOSPITAL_COMMUNITY)
Admission: EM | Admit: 2022-08-22 | Discharge: 2022-08-23 | Disposition: A | Discharge status: Home / Self Care | Payer: Commercial Managed Care - HMO | Attending: Internal Medicine | Admitting: Internal Medicine

## 2022-08-22 ENCOUNTER — Emergency Department (HOSPITAL_COMMUNITY): Payer: Commercial Managed Care - HMO

## 2022-08-22 DIAGNOSIS — N920 Excessive and frequent menstruation with regular cycle: Secondary | ICD-10-CM | POA: Insufficient documentation

## 2022-08-22 DIAGNOSIS — D500 Iron deficiency anemia secondary to blood loss (chronic): Secondary | ICD-10-CM | POA: Diagnosis not present

## 2022-08-22 DIAGNOSIS — Z79899 Other long term (current) drug therapy: Secondary | ICD-10-CM | POA: Insufficient documentation

## 2022-08-22 DIAGNOSIS — R1031 Right lower quadrant pain: Secondary | ICD-10-CM

## 2022-08-22 DIAGNOSIS — D649 Anemia, unspecified: Secondary | ICD-10-CM

## 2022-08-22 DIAGNOSIS — D259 Leiomyoma of uterus, unspecified: Secondary | ICD-10-CM

## 2022-08-22 DIAGNOSIS — D509 Iron deficiency anemia, unspecified: Secondary | ICD-10-CM

## 2022-08-22 DIAGNOSIS — N809 Endometriosis, unspecified: Secondary | ICD-10-CM

## 2022-08-22 LAB — COMPREHENSIVE METABOLIC PANEL
ALT: 9 U/L (ref 0–44)
AST: 30 U/L (ref 15–41)
Albumin: 3.8 g/dL (ref 3.5–5.0)
Alkaline Phosphatase: 59 U/L (ref 38–126)
Anion gap: 13 (ref 5–15)
BUN: 10 mg/dL (ref 6–20)
CO2: 16 mmol/L — ABNORMAL LOW (ref 22–32)
Calcium: 9 mg/dL (ref 8.9–10.3)
Chloride: 110 mmol/L (ref 98–111)
Creatinine, Ser: 0.82 mg/dL (ref 0.44–1.00)
GFR, Estimated: >60 mL/min (ref >60)
Glucose, Bld: 98 mg/dL (ref 70–99)
Potassium: 4.4 mmol/L (ref 3.5–5.1)
Sodium: 139 mmol/L (ref 135–145)
Total Bilirubin: 0.3 mg/dL (ref 0.3–1.2)
Total Protein: 8.3 g/dL — ABNORMAL HIGH (ref 6.5–8.1)

## 2022-08-22 LAB — I-STAT BETA HCG BLOOD, ED (MC, WL, AP ONLY): I-stat hCG, quantitative: <5 m[IU]/mL (ref <5)

## 2022-08-22 LAB — LIPASE, BLOOD: Lipase: 38 U/L (ref 11–51)

## 2022-08-22 NOTE — ED Triage Notes (Signed)
Patient having severe pain in her right side onset yesterday while at work. Patient works in [employer]. Patient states the pain eased up but came back today causing the Patient to vomit. Patient states the pain has been constant today. Describes the pain as a combination of menstrual cramps and insides turning. Feels like she has to have a BM and throw up.    No new medications, no changes in diet. No known sick exposure, no one around the Patient with similar symptoms. No urinary symptoms, no diarrhea or constipation.

## 2022-08-22 NOTE — ED Provider Triage Note (Signed)
Emergency Medicine Provider Triage Evaluation Note  Dawn Lyons , a 46 y.o. female  was evaluated in triage.  Pt complains of right lower quadrant abdominal pain that started yesterday associated with nausea and vomiting.  Patient evaluated at urgent care prior to arrival and sent to the ED to rule out appendicitis.  Denies urinary symptoms.  Admits to increased vaginal discharge however, notes she has not been sexually active in over a year.  Review of Systems  Positive: Abdominal pain Negative: dysuria  Physical Exam  BP (!) 145/85 (BP Location: Right Arm)   Pulse 77   Temp 98.8 F (37.1 C) (Oral)   Resp 16   LMP 08/10/2022 (Exact Date)   SpO2 99%  Gen:   Awake, no distress   Resp:  Normal effort  MSK:   Moves extremities without difficulty  Other:  RLQ TTP  Medical Decision Making  Medically screening exam initiated at 9:05 PM.  Appropriate orders placed.  KASSITY WOODSON was informed that the remainder of the evaluation will be completed by another provider, this initial triage assessment does not replace that evaluation, and the importance of remaining in the ED until their evaluation is complete.  Abdominal labs CT to rule out appendicitis   Suzy Bouchard, Hershal Coria 08/22/22 2108

## 2022-08-22 NOTE — ED Triage Notes (Signed)
Pt c/o RLQ pain that started yesterday. Pt reports nausea/vomiting started today. Denies urinary symptoms, or fever.

## 2022-08-22 NOTE — ED Notes (Signed)
Patient is being discharged from the Urgent Care and sent to the Emergency Department via personal Vehicle. Per Dr. Windy Carina, patient is in need of higher level of care due to Increased flank pain and vomiting. Patient is aware and verbalizes understanding of plan of care.  Vitals:   08/22/22 1734  BP: (!) 153/60  Pulse: 80  Resp: 16  Temp: 98.7 F (37.1 C)  SpO2: 99%

## 2022-08-22 NOTE — ED Provider Notes (Signed)
[place]    CSN: [id number] Arrival date & time: 08/22/22  1655      History   Chief Complaint Chief Complaint  Patient presents with   Abdominal Pain   Emesis    HPI Dawn Lyons is a 46 y.o. female.    Abdominal Pain Associated symptoms: vomiting   Emesis Associated symptoms: abdominal pain    Here for right lower quadrant pain and vomiting.  The right lower quadrant pain started yesterday.  It improved and then started again today.  She did throw up today at least twice.  Once she was here she threw up and had a nosebleed start in the exam room.  No fever or chills.  She has had cough but that somewhat has been going on for a week or more.  Last menstrual cycle was August 11.  The pain in her right lower quadrant is making it hard for her to walk Past Medical History:  Diagnosis Date   Endometriosis    Fibroid uterus    PID (pelvic inflammatory disease)    Trichimoniasis     There are no problems to display for this patient.   Past Surgical History:  Procedure Laterality Date   LAPAROSCOPIC ENDOMETRIOSIS FULGURATION      OB History     Gravida  0   Para      Term      Preterm      AB      Living         SAB      IAB      Ectopic      Multiple      Live Births               Home Medications    Prior to Admission medications   Medication Sig Start Date End Date Taking? Authorizing Provider  famotidine (PEPCID) 20 MG tablet Take 1 tablet (20 mg total) by mouth 2 (two) times daily. Take 1 pill with breakfast and 1 pill with dinner for 1 week.  After this, you can take as needed up to twice daily with meals. 09/19/21   Hazel Sams, PA-C  fluconazole (DIFLUCAN) 200 MG tablet Take once today. Take second pill at completion of antibiotics. 09/18/19   Zigmund Gottron, NP  loperamide (IMODIUM) 2 MG capsule Take 1 capsule (2 mg total) by mouth 4 (four) times daily as needed for diarrhea or loose stools. 09/19/21    Hazel Sams, PA-C  naproxen (NAPROSYN) 500 MG tablet Take 1 tablet (500 mg total) by mouth 2 (two) times daily. 09/23/19   Wieters, Hallie C, PA-C  Naproxen Sodium 220 MG CAPS Take 440 mg by mouth.    [provider]  ondansetron (ZOFRAN ODT) 8 MG disintegrating tablet Take 1 tablet (8 mg total) by mouth every 8 (eight) hours as needed for nausea or vomiting. 09/19/21   Hazel Sams, PA-C  promethazine-dextromethorphan (PROMETHAZINE-DM) 6.25-15 MG/5ML syrup Take 5 mLs by mouth 3 (three) times daily as needed for cough. 08/07/21   Raspet, Derry Skill, PA-C  SUMAtriptan (IMITREX) 50 MG tablet Take 1 tablet (50 mg total) by mouth every 2 (two) hours as needed for migraine. May repeat in 2 hours if headache persists or recurs. 05/24/22   Carvel Getting, NP    Family History Family History  Problem Relation Age of Onset   Hypertension Mother    Hypertension Sister    Diabetes Maternal Grandmother  Social History Social History   Tobacco Use   Smoking status: Never   Smokeless tobacco: Never  Vaping Use   Vaping Use: Never used  Substance Use Topics   Alcohol use: No   Drug use: No     Allergies   Patient has no known allergies.   Review of Systems Review of Systems  Gastrointestinal:  Positive for abdominal pain and vomiting.     Physical Exam Triage Vital Signs ED Triage Vitals  Enc Vitals Group     BP 08/22/22 1734 (!) 153/60     Pulse Rate 08/22/22 1734 80     Resp 08/22/22 1734 16     Temp 08/22/22 1734 98.7 F (37.1 C)     Temp Source 08/22/22 1734 Oral     SpO2 08/22/22 1734 99 %     Weight 08/22/22 1737 186 lb 15.2 oz (84.8 kg)     Height 08/22/22 1737 '5\' 2"'$  (1.575 m)     Head Circumference --      Peak Flow --      Pain Score 08/22/22 1737 10     Pain Loc --      Pain Edu? --      Excl. in [place]? --    No data found.  Updated Vital Signs BP (!) 153/60 (BP Location: Right Arm)   Pulse 80   Temp 98.7 F (37.1 C) (Oral)   Resp 16   Ht '5\' 2"'$   (1.575 m)   Wt 84.8 kg   LMP 08/10/2022 (Exact Date)   SpO2 99%   BMI 34.19 kg/m   Visual Acuity Right Eye Distance:   Left Eye Distance:   Bilateral Distance:    Right Eye Near:   Left Eye Near:    Bilateral Near:     Physical Exam Vitals reviewed.  Constitutional:      General: She is not in acute distress.    Appearance: She is not ill-appearing, toxic-appearing or diaphoretic.  HENT:     Nose:     Comments: There is bright red blood in the right nostril.    Mouth/Throat:     Mouth: Mucous membranes are moist.  Eyes:     Extraocular Movements: Extraocular movements intact.     Pupils: Pupils are equal, round, and reactive to light.  Cardiovascular:     Rate and Rhythm: Normal rate and regular rhythm.  Pulmonary:     Breath sounds: Normal breath sounds.  Abdominal:     Palpations: Abdomen is soft.     Tenderness: There is abdominal tenderness (Right lower quadrant). There is guarding (Right lower quadrant).  Skin:    Coloration: Skin is not jaundiced or pale.  Neurological:     Mental Status: She is alert and oriented to person, place, and time.  Psychiatric:        Behavior: Behavior normal.      UC Treatments / Results  Labs (all labs ordered are listed, but only abnormal results are displayed) Labs Reviewed - No data to display  EKG   Radiology No results found.  Procedures Procedures (including critical care time)  Medications Ordered in UC Medications - No data to display  Initial Impression / Assessment and Plan / UC Course  I have reviewed the triage vital signs and the nursing notes.  Pertinent labs & imaging results that were available during my care of the patient were reviewed by me and considered in my medical decision making (see chart for details).  She is going to go to the emergency room for higher level of care and evaluation then we can provide here.  I am concerned for of course appendix, right ovarian pathology or other  acute illness that needs some imaging and lab work Final Clinical Impressions(s) / UC Diagnoses   Final diagnoses:  RLQ abdominal pain     Discharge Instructions      Please proceed to the emergency room for higher level of care and evaluation then we can accomplish here in the urgent care    ED Prescriptions   None    PDMP not reviewed this encounter.   Barrett Henle, MD 08/22/22 [phone]

## 2022-08-22 NOTE — Discharge Instructions (Addendum)
Please proceed to the emergency room for higher level of care and evaluation then we can accomplish here in the urgent care

## 2022-08-23 ENCOUNTER — Emergency Department (HOSPITAL_COMMUNITY): Payer: Commercial Managed Care - HMO

## 2022-08-23 DIAGNOSIS — N809 Endometriosis, unspecified: Secondary | ICD-10-CM

## 2022-08-23 DIAGNOSIS — D259 Leiomyoma of uterus, unspecified: Secondary | ICD-10-CM

## 2022-08-23 DIAGNOSIS — D649 Anemia, unspecified: Secondary | ICD-10-CM

## 2022-08-23 DIAGNOSIS — D509 Iron deficiency anemia, unspecified: Secondary | ICD-10-CM

## 2022-08-23 DIAGNOSIS — R1031 Right lower quadrant pain: Secondary | ICD-10-CM

## 2022-08-23 DIAGNOSIS — N920 Excessive and frequent menstruation with regular cycle: Secondary | ICD-10-CM | POA: Insufficient documentation

## 2022-08-23 LAB — CBC WITH DIFFERENTIAL/PLATELET
Abs Immature Granulocytes: 0.04 10*3/uL (ref 0.00–0.07)
Basophils Absolute: 0 10*3/uL (ref 0.0–0.1)
Basophils Relative: 0 %
Eosinophils Absolute: 0 10*3/uL (ref 0.0–0.5)
Eosinophils Relative: 0 %
HCT: 21.2 % — ABNORMAL LOW (ref 36.0–46.0)
Hemoglobin: 5.2 g/dL — CL (ref 12.0–15.0)
Immature Granulocytes: 1 %
Lymphocytes Relative: 23 %
Lymphs Abs: 1.8 10*3/uL (ref 0.7–4.0)
MCH: 15.4 pg — ABNORMAL LOW (ref 26.0–34.0)
MCHC: 24.5 g/dL — ABNORMAL LOW (ref 30.0–36.0)
MCV: 62.9 fL — ABNORMAL LOW (ref 80.0–100.0)
Monocytes Absolute: 0.4 10*3/uL (ref 0.1–1.0)
Monocytes Relative: 5 %
Neutro Abs: 5.7 10*3/uL (ref 1.7–7.7)
Neutrophils Relative %: 71 %
Platelets: 277 10*3/uL (ref 150–400)
RBC: 3.37 MIL/uL — ABNORMAL LOW (ref 3.87–5.11)
RDW: 20.9 % — ABNORMAL HIGH (ref 11.5–15.5)
WBC: 7.9 10*3/uL (ref 4.0–10.5)
nRBC: 0 % (ref 0.0–0.2)

## 2022-08-23 LAB — FOLATE: Folate: 10.2 ng/mL (ref >5.9)

## 2022-08-23 LAB — IRON AND TIBC
Iron: 16 ug/dL — ABNORMAL LOW (ref 28–170)
Saturation Ratios: 3 % — ABNORMAL LOW (ref 10.4–31.8)
TIBC: 552 ug/dL — ABNORMAL HIGH (ref 250–450)
UIBC: 536 ug/dL

## 2022-08-23 LAB — RETICULOCYTES
Immature Retic Fract: 19.6 % — ABNORMAL HIGH (ref 2.3–15.9)
RBC.: 3.15 MIL/uL — ABNORMAL LOW (ref 3.87–5.11)
Retic Count, Absolute: 35.3 10*3/uL (ref 19.0–186.0)
Retic Ct Pct: 1.1 % (ref 0.4–3.1)

## 2022-08-23 LAB — PREPARE RBC (CROSSMATCH)

## 2022-08-23 LAB — ABO/RH: ABO/RH(D): AB NEG

## 2022-08-23 LAB — HEMOGLOBIN AND HEMATOCRIT, BLOOD
HCT: 25.5 % — ABNORMAL LOW (ref 36.0–46.0)
Hemoglobin: 7.1 g/dL — ABNORMAL LOW (ref 12.0–15.0)

## 2022-08-23 LAB — HIV ANTIBODY (ROUTINE TESTING W REFLEX): HIV Screen 4th Generation wRfx: NONREACTIVE

## 2022-08-23 LAB — FERRITIN: Ferritin: 2 ng/mL — ABNORMAL LOW (ref 11–307)

## 2022-08-23 LAB — VITAMIN B12: Vitamin B-12: 389 pg/mL (ref 180–914)

## 2022-08-23 MED ORDER — ONDANSETRON HCL 4 MG/2ML IJ SOLN
4.0000 mg | Freq: Once | INTRAMUSCULAR | Status: AC
  Administered 2022-08-23: 4 mg via INTRAVENOUS
  Filled 2022-08-23: qty 2

## 2022-08-23 MED ORDER — ONDANSETRON HCL 4 MG/2ML IJ SOLN: 4.0000 mg | Freq: Four times a day (QID) | INTRAMUSCULAR | Status: DC | PRN

## 2022-08-23 MED ORDER — FENTANYL CITRATE PF 50 MCG/ML IJ SOSY
50.0000 ug | PREFILLED_SYRINGE | Freq: Once | INTRAMUSCULAR | Status: AC
  Administered 2022-08-23: 50 ug via INTRAVENOUS
  Filled 2022-08-23: qty 1

## 2022-08-23 MED ORDER — FERROUS SULFATE 325 (65 FE) MG PO TABS: 325.0000 mg | ORAL_TABLET | Freq: Two times a day (BID) | ORAL | 0 refills | Status: DC

## 2022-08-23 MED ORDER — ACETAMINOPHEN 650 MG RE SUPP: 650.0000 mg | Freq: Four times a day (QID) | RECTAL | Status: DC | PRN

## 2022-08-23 MED ORDER — SODIUM CHLORIDE 0.9 % IV SOLN
10.0000 mL/h | Freq: Once | INTRAVENOUS | Status: AC
  Administered 2022-08-23: 10 mL/h via INTRAVENOUS

## 2022-08-23 MED ORDER — IOHEXOL 300 MG/ML  SOLN
80.0000 mL | Freq: Once | INTRAMUSCULAR | Status: AC | PRN
Start: 2022-08-23 — End: 2022-08-23
  Administered 2022-08-23: 80 mL via INTRAVENOUS

## 2022-08-23 MED ORDER — FERROUS SULFATE 325 (65 FE) MG PO TABS
325.0000 mg | ORAL_TABLET | Freq: Two times a day (BID) | ORAL | Status: DC
  Administered 2022-08-23: 325 mg via ORAL
  Filled 2022-08-23: qty 1

## 2022-08-23 MED ORDER — SODIUM CHLORIDE 0.9 % IV BOLUS
1000.0000 mL | Freq: Once | INTRAVENOUS | Status: AC
  Administered 2022-08-23: 1000 mL via INTRAVENOUS

## 2022-08-23 MED ORDER — FOLIC ACID 1 MG PO TABS: 1.0000 mg | ORAL_TABLET | Freq: Every day | ORAL | 0 refills | Status: AC

## 2022-08-23 MED ORDER — ACETAMINOPHEN 325 MG PO TABS: 650.0000 mg | ORAL_TABLET | Freq: Four times a day (QID) | ORAL | Status: DC | PRN

## 2022-08-23 MED ORDER — ONDANSETRON HCL 4 MG PO TABS: 4.0000 mg | ORAL_TABLET | Freq: Four times a day (QID) | ORAL | Status: DC | PRN

## 2022-08-23 NOTE — ED Provider Notes (Signed)
[facility] EMERGENCY DEPARTMENT Provider Note   CSN: [id number] Arrival date & time: 08/22/22  1815     History  Chief Complaint  Patient presents with   Abdominal Pain    ADAN BEAL is a 46 y.o. female who  has a past medical history of Endometriosis, Fibroid uterus, PID (pelvic inflammatory disease), and Trichimoniasis. Patient c/o RLQ abdominopelvic pain. Onset yesterday morning. Intermittent. Resolved and then returned yesterday afternoon and was severe. She has associated vomiting. Denies vaginal sxs or urinary sxs.  Past SHx of Laparoscopic endometriosis fulguration. LMP 08/10/2022.   Abdominal Pain      Home Medications Prior to Admission medications   Medication Sig Start Date End Date Taking? Authorizing Provider  famotidine (PEPCID) 20 MG tablet Take 1 tablet (20 mg total) by mouth 2 (two) times daily. Take 1 pill with breakfast and 1 pill with dinner for 1 week.  After this, you can take as needed up to twice daily with meals. 09/19/21   Hazel Sams, PA-C  fluconazole (DIFLUCAN) 200 MG tablet Take once today. Take second pill at completion of antibiotics. 09/18/19   Zigmund Gottron, NP  loperamide (IMODIUM) 2 MG capsule Take 1 capsule (2 mg total) by mouth 4 (four) times daily as needed for diarrhea or loose stools. 09/19/21   Hazel Sams, PA-C  naproxen (NAPROSYN) 500 MG tablet Take 1 tablet (500 mg total) by mouth 2 (two) times daily. 09/23/19   Wieters, Hallie C, PA-C  Naproxen Sodium 220 MG CAPS Take 440 mg by mouth.    [provider]  ondansetron (ZOFRAN ODT) 8 MG disintegrating tablet Take 1 tablet (8 mg total) by mouth every 8 (eight) hours as needed for nausea or vomiting. 09/19/21   Hazel Sams, PA-C  promethazine-dextromethorphan (PROMETHAZINE-DM) 6.25-15 MG/5ML syrup Take 5 mLs by mouth 3 (three) times daily as needed for cough. 08/07/21   Raspet, Derry Skill, PA-C  SUMAtriptan (IMITREX) 50 MG tablet Take 1 tablet (50 mg total)  by mouth every 2 (two) hours as needed for migraine. May repeat in 2 hours if headache persists or recurs. 05/24/22   Carvel Getting, NP      Allergies    Patient has no known allergies.    Review of Systems   Review of Systems  Gastrointestinal:  Positive for abdominal pain.    Physical Exam Updated Vital Signs BP 135/76 (BP Location: Left Arm)   Pulse 80   Temp 98.8 F (37.1 C) (Oral)   Resp 18   LMP 08/10/2022 (Exact Date)   SpO2 100%  Physical Exam Vitals and nursing note reviewed.  Constitutional:      General: She is not in acute distress.    Appearance: She is well-developed. She is not diaphoretic.  HENT:     Head: Normocephalic and atraumatic.     Right Ear: External ear normal.     Left Ear: External ear normal.     Nose: Nose normal.     Mouth/Throat:     Mouth: Mucous membranes are moist.  Eyes:     General: No scleral icterus.    Conjunctiva/sclera: Conjunctivae normal.  Cardiovascular:     Rate and Rhythm: Normal rate and regular rhythm.     Heart sounds: Normal heart sounds. No murmur heard.    No friction rub. No gallop.  Pulmonary:     Effort: Pulmonary effort is normal. No respiratory distress.     Breath sounds: Normal breath  sounds.  Abdominal:     General: Bowel sounds are normal. There is no distension.     Palpations: Abdomen is soft. There is no mass.     Tenderness: There is abdominal tenderness in the right lower quadrant, suprapubic area and left lower quadrant. There is guarding.     Comments: Firm distension in the infraumbilical region of the abdomen.+ involuntary guarding, + tenderness  Musculoskeletal:     Cervical back: Normal range of motion.  Skin:    General: Skin is warm and dry.  Neurological:     Mental Status: She is alert and oriented to person, place, and time.  Psychiatric:        Behavior: Behavior normal.     ED Results / Procedures / Treatments   Labs (all labs ordered are listed, but only abnormal results are  displayed) Labs Reviewed  COMPREHENSIVE METABOLIC PANEL - Abnormal; Notable for the following components:      Result Value   CO2 16 (*)    Total Protein 8.3 (*)    All other components within normal limits  LIPASE, BLOOD  CBC WITH DIFFERENTIAL/PLATELET  URINALYSIS, ROUTINE W REFLEX MICROSCOPIC  CBC WITH DIFFERENTIAL/PLATELET  I-STAT BETA HCG BLOOD, ED ([facility], [facility], [facility] ONLY)    EKG None  Radiology No results found.  Procedures .Critical Care  Performed by: Margarita Mail, PA-C Authorized by: Margarita Mail, PA-C   Critical care provider statement:    Critical care time (minutes):  65   Critical care time was exclusive of:  Separately billable procedures and treating other patients   Critical care was necessary to treat or prevent imminent or life-threatening deterioration of the following conditions:  Circulatory failure   Critical care was time spent personally by me on the following activities:  Development of treatment plan with patient or surrogate, discussions with consultants, evaluation of patient's response to treatment, examination of patient, ordering and review of laboratory studies, ordering and review of radiographic studies, ordering and performing treatments and interventions, pulse oximetry, re-evaluation of patient's condition and review of old charts     Medications Ordered in ED Medications - No data to display  ED Course/ Medical Decision Making/ A&P Clinical Course as of 08/23/22 0501  Thu Aug 23, 2022  0055 Comprehensive metabolic panel(!) [AH]  6283 I-Stat Beta hCG blood, ED ([facility], [facility], [facility] only) [AH]  0212 US Pelvis Complete [AH]  0212 US Transvaginal Non-OB [AH]  0212 [place] Art/Ven Flow Abd Pelv Doppler I reviewed and independently interpreted pelvic ultrasound.  Left ovary not visualized, right ovary with normal blood flow, leiomyomatous uterus. Patient also notably with a hemoglobin of 5.2.  I discussed with the patient if she is having extremely heavy  periods which she endorses.  Patient also notes that she has been extremely weak, tired, short of breath and feeling like she is going to pass out. [AH]    Clinical Course User Index [AH] Margarita Mail, PA-C                           Medical Decision Making Patient here with RLQ abd pain. LOUANNA VANLIEW is a 46 y.o. female who presents with RLQ abd pain The history is gathered by patient and emr     I personally reviewed the patient's labs which show incidental finding of HGB of 5.2- patient acknowledges symptomatic anemia.     Given the large differential diagnosis for Burr Medico, the decision  making in this case is of high complexity.  After evaluating all of the data points in this case, the presentation of LORRINE KILLILEA is NOT consistent with AAA; Mesenteric Ischemia; Bowel Perforation; Bowel Obstruction; Sigmoid Volvulus; Diverticulitis; Appendicitis; Peritonitis; Cholecystitis, ascending cholangitis or other gallbladder disease; perforated ulcer; significant GI bleeding, splenic rupture/infarction; Hepatic abscess; or other surgical/acute abdomen.  Similarly, this presentation is NOT consistent with fistula; incarcerated hernia; Pancreatitis; Diabetic Ketoacidosis; Kidney Stone; Ischemic colitis; Psoas or other abscess; Methanol poisoning; Heavy metal toxicity; or porphyria.  The case is NOT consistent with Fitz-Hugh-Curtis Syndrome, Ectopic Pregnancy, Placental Abruption, PID, Tubo-ovarian abscess, Ovarian Torsion, or STI. This presentation is also NOT consistent with acute coronary syndrome, MI, pulmonary embolism, dissection, borhaave's, arrythmia, pneumothorax, cardiac tamponade, or other emergent cardiopulmonary condition. Moreover, this presentation is NOT consistent with sepsis, pyelonephritis, urinary infection, pneumonia, or other focal bacterial infection.   Due to patient's severe anemia she will need admission. I have ordered 2 units of blood for transfusion and  anemia panel. Case discussed with Dr. Damita Dunnings who will admit the patient.   Problems Addressed: Symptomatic anemia: chronic illness or injury  Amount and/or Complexity of Data Reviewed Labs: ordered. Decision-making details documented in ED Course. Radiology: ordered and independent interpretation performed. Decision-making details documented in ED Course.    Details: Pelvic US and CT - I personally interpreted these images. No findings of torsion . + r sided pelvic free fluid. Large fibroid uterus.  Risk Prescription drug management. Decision regarding hospitalization.           Final Clinical Impression(s) / ED Diagnoses Final diagnoses:  Symptomatic anemia    Rx / DC Orders ED Discharge Orders     None         Margarita Mail, PA-C 08/23/22 0516    Margette Fast, MD 08/24/22 [phone]

## 2022-08-23 NOTE — ED Notes (Signed)
Pt taken to CT.

## 2022-08-23 NOTE — H&P (Addendum)
History and Physical    Patient: Dawn Lyons GUR:[id number] DOB: [birth year] DOA: 08/22/2022 DOS: the patient was seen and examined on 08/23/2022 PCP: Pcp, No  Patient coming from: Home  Chief Complaint:  Chief Complaint  Patient presents with   Abdominal Pain    HPI: Dawn Lyons is a 46 y.o. female with medical history significant for Fibroids and endometriosis who presents to the ED with a 2-day history of right lower quadrant pain associated with nausea and an episode of nonbloody, nonbilious emesis.  She denies fever or chills, diarrhea or dysuria and denies cough, chest pain or shortness of breath.  She reports heavy menstrual bleeding monthly with last cycle 8/11 to 8/18.  Denies blood in stool ED course and data review: BP 145/85 with otherwise normal vitals.  Blood work significant for hemoglobin of 5.2.  Most recent hemoglobin on record 6.1 a year ago Patient had CT abdomen and pelvis as well as pelvic ultrasound that showed the following findings: [place]:  IMPRESSION: 1. Enlarged myomatous uterus. 2. Grossly unremarkable right ovary. 3. Nonvisualization of the left ovary and poorly visualized endometrium.  CT:  IMPRESSION: 1. No acute intra-abdominal pathology identified. No definite radiographic explanation for the patient's reported symptoms. Normal appendix. 2. Enlarged fibroid uterus measuring up to 18.2 cm in greatest dimension. 3. Trace free intraperitoneal fluid within the right adnexa tracking into the right pericolic gutter, possibly related to a ruptured corpus luteum.   Treated with an IV fluid bolus, fentanyl for pain in the ED and started on 1 of 2 units PRBCs.  Hospitalist consulted for admission.   Review of Systems: As mentioned in the history of present illness. All other systems reviewed and are negative.  Past Medical History:  Diagnosis Date   Endometriosis    Fibroid uterus    PID (pelvic inflammatory disease)    Trichimoniasis    Past  Surgical History:  Procedure Laterality Date   LAPAROSCOPIC ENDOMETRIOSIS FULGURATION     Social History:  reports that she has never smoked. She has never used smokeless tobacco. She reports that she does not drink alcohol and does not use drugs.  No Known Allergies  Family History  Problem Relation Age of Onset   Hypertension Mother    Hypertension Sister    Diabetes Maternal Grandmother     Prior to Admission medications   Medication Sig Start Date End Date Taking? Authorizing Provider  famotidine (PEPCID) 20 MG tablet Take 1 tablet (20 mg total) by mouth 2 (two) times daily. Take 1 pill with breakfast and 1 pill with dinner for 1 week.  After this, you can take as needed up to twice daily with meals. 09/19/21   Ariatna Jester Sams, PA-C  fluconazole (DIFLUCAN) 200 MG tablet Take once today. Take second pill at completion of antibiotics. 09/18/19   Zigmund Gottron, NP  loperamide (IMODIUM) 2 MG capsule Take 1 capsule (2 mg total) by mouth 4 (four) times daily as needed for diarrhea or loose stools. 09/19/21   Roberto Romanoski Sams, PA-C  naproxen (NAPROSYN) 500 MG tablet Take 1 tablet (500 mg total) by mouth 2 (two) times daily. 09/23/19   Wieters, Hallie C, PA-C  Naproxen Sodium 220 MG CAPS Take 440 mg by mouth.    [provider]  ondansetron (ZOFRAN ODT) 8 MG disintegrating tablet Take 1 tablet (8 mg total) by mouth every 8 (eight) hours as needed for nausea or vomiting. 09/19/21   Joselynn Amoroso Sams, PA-C  promethazine-dextromethorphan (  PROMETHAZINE-DM) 6.25-15 MG/5ML syrup Take 5 mLs by mouth 3 (three) times daily as needed for cough. 08/07/21   Raspet, Derry Skill, PA-C  SUMAtriptan (IMITREX) 50 MG tablet Take 1 tablet (50 mg total) by mouth every 2 (two) hours as needed for migraine. May repeat in 2 hours if headache persists or recurs. 05/24/22   Carvel Getting, NP    Physical Exam: Vitals:   08/23/22 0044 08/23/22 0215 08/23/22 0309 08/23/22 0330  BP: 135/76  133/75 136/76  Pulse: 80 96  77 79  Resp: '18  16 12  '$ Temp:    98.5 F (36.9 C)  TempSrc:    Oral  SpO2: 100% 96% 100% 100%   Physical Exam Vitals and nursing note reviewed.  Constitutional:      General: She is not in acute distress.    Comments: Frail-appearing  HENT:     Head: Normocephalic and atraumatic.  Cardiovascular:     Rate and Rhythm: Normal rate and regular rhythm.     Heart sounds: Normal heart sounds.  Pulmonary:     Effort: Pulmonary effort is normal.     Breath sounds: Normal breath sounds.  Abdominal:     Palpations: Abdomen is soft.     Tenderness: There is no abdominal tenderness.  Skin:    Coloration: Skin is pale.  Neurological:     Mental Status: Mental status is at baseline.     Labs on Admission: I have personally reviewed following labs and imaging studies  CBC: Recent Labs  Lab 08/23/22 0107  WBC 7.9  NEUTROABS 5.7  HGB 5.2*  HCT 21.2*  MCV 62.9*  PLT 650   Basic Metabolic Panel: Recent Labs  Lab 08/22/22 2122  NA 139  K 4.4  CL 110  CO2 16*  GLUCOSE 98  BUN 10  CREATININE 0.82  CALCIUM 9.0   GFR: Estimated Creatinine Clearance: 88.5 mL/min (by C-G formula based on SCr of 0.82 mg/dL). Liver Function Tests: Recent Labs  Lab 08/22/22 2122  AST 30  ALT 9  ALKPHOS 59  BILITOT 0.3  PROT 8.3*  ALBUMIN 3.8   Recent Labs  Lab 08/22/22 2122  LIPASE 38   No results for input(s): "AMMONIA" in the last 168 hours. Coagulation Profile: No results for input(s): "INR", "PROTIME" in the last 168 hours. Cardiac Enzymes: No results for input(s): "CKTOTAL", "CKMB", "CKMBINDEX", "TROPONINI" in the last 168 hours. BNP (last 3 results) No results for input(s): "PROBNP" in the last 8760 hours. HbA1C: No results for input(s): "HGBA1C" in the last 72 hours. CBG: No results for input(s): "GLUCAP" in the last 168 hours. Lipid Profile: No results for input(s): "CHOL", "HDL", "LDLCALC", "TRIG", "CHOLHDL", "LDLDIRECT" in the last 72 hours. Thyroid Function  Tests: No results for input(s): "TSH", "T4TOTAL", "FREET4", "T3FREE", "THYROIDAB" in the last 72 hours. Anemia Panel: Recent Labs    08/23/22 0330  VITAMINB12 389  FERRITIN 2*  TIBC 552*  IRON 16*  RETICCTPCT 1.1   Urine analysis:    Component Value Date/Time   COLORURINE YELLOW 06/04/2015 2002   APPEARANCEUR TURBID (A) 06/04/2015 2002   LABSPEC >=1.030 08/25/2020 1536   PHURINE 5.5 08/25/2020 1536   GLUCOSEU NEGATIVE 08/25/2020 1536   HGBUR NEGATIVE 08/25/2020 [place] 08/25/2020 1536   KETONESUR NEGATIVE 08/25/2020 1536   PROTEINUR NEGATIVE 08/25/2020 1536   UROBILINOGEN 0.2 08/25/2020 1536   NITRITE NEGATIVE 08/25/2020 1536   LEUKOCYTESUR NEGATIVE 08/25/2020 1536    Radiological Exams on Admission: CT  ABDOMEN PELVIS W CONTRAST  Result Date: 08/23/2022 CLINICAL DATA:  Right lower quadrant abdominal pain EXAM: CT ABDOMEN AND PELVIS WITH CONTRAST TECHNIQUE: Multidetector CT imaging of the abdomen and pelvis was performed using the standard protocol following bolus administration of intravenous contrast. RADIATION DOSE REDUCTION: This exam was performed according to the departmental dose-optimization program which includes automated exposure control, adjustment of the mA and/or kV according to patient size and/or use of iterative reconstruction technique. CONTRAST:  31m OMNIPAQUE IOHEXOL 300 MG/ML  SOLN COMPARISON:  None Available. FINDINGS: Lower chest: No acute abnormality. Hepatobiliary: No focal liver abnormality is seen. No gallstones, gallbladder wall thickening, or biliary dilatation. Pancreas: Unremarkable Spleen: Unremarkable Adrenals/Urinary Tract: The adrenal glands are unremarkable. The kidneys are normal in size and position. Simple exophytic cortical cyst arises from the lower pole the left kidney. No follow-up imaging is recommended for this lesion. The kidneys are otherwise unremarkable. The bladder is unremarkable. Stomach/Bowel: The stomach, small  bowel, and large bowel are unremarkable. The appendix is normal. A trace amount of free intraperitoneal fluid is seen within the right adnexa tracking into the right pericolic gutter. No free intraperitoneal gas. Vascular/Lymphatic: No significant vascular findings are present. No enlarged abdominal or pelvic lymph nodes. Reproductive: The uterus is enlarged with numerous heterogeneously enhancing intrauterine masses in keeping with innumerable uterine fibroids. The uterus measures at least 10.3 x 13.9 x 18.2 cm (volume = 1360 cm^3) in greatest dimension. Corpus luteum noted within the right ovary. Other: No abdominal wall hernia Musculoskeletal: No acute bone abnormality. No lytic or blastic bone lesion. IMPRESSION: 1. No acute intra-abdominal pathology identified. No definite radiographic explanation for the patient's reported symptoms. Normal appendix. 2. Enlarged fibroid uterus measuring up to 18.2 cm in greatest dimension. 3. Trace free intraperitoneal fluid within the right adnexa tracking into the right pericolic gutter, possibly related to a ruptured corpus luteum. Electronically Signed   By: AFidela SalisburyM.D.   On: 08/23/2022 04:16   [place]vis Complete  Result Date: 08/23/2022 CLINICAL DATA:  Right lower quadrant pelvic pain. EXAM: TRANSABDOMINAL AND TRANSVAGINAL ULTRASOUND OF PELVIS DOPPLER ULTRASOUND OF OVARIES TECHNIQUE: Both transabdominal and transvaginal ultrasound examinations of the pelvis were performed. Transabdominal technique was performed for global imaging of the pelvis including uterus, ovaries, adnexal regions, and pelvic cul-de-sac. It was necessary to proceed with endovaginal exam following the transabdominal exam to visualize the endometrium and ovaries. Color and duplex Doppler ultrasound was utilized to evaluate blood flow to the ovaries. COMPARISON:  None Available. FINDINGS: Uterus Measurements: 16.3 x 8.4 x 12.4 cm = volume: 890 mL. The uterus is enlarged and myomatous.  Multiple fibroids including a 4.1 x 3.8 x 3.9 cm right fundal intramural fibroid, a 6.4 x 5.7 x 6.0 cm left anterior intramural fibroid, and a 5.1 x 4.2 x 4.0 cm left anterior lower uterine intramural fibroid. Endometrium Thickness: 7 mm. The endometrium is poorly visualized and suboptimally evaluated. Right ovary Measurements: 7.0 x 3.2 x 3.7 cm = volume: 44 mL. Probable small complex/hemorrhagic dominant follicle or corpus luteum. Doppler images demonstrate arterial and venous flow the right ovary. Left ovary Not visualized. Other findings No abnormal free fluid. IMPRESSION: 1. Enlarged myomatous uterus. 2. Grossly unremarkable right ovary. 3. Nonvisualization of the left ovary and poorly visualized endometrium. Electronically Signed   By: AAnner CreteM.D.   On: 08/23/2022 02:01   [place]vaginal Non-OB  Result Date: 08/23/2022 CLINICAL DATA:  Right lower quadrant pelvic pain. EXAM: TRANSABDOMINAL AND TRANSVAGINAL ULTRASOUND OF PELVIS  DOPPLER ULTRASOUND OF OVARIES TECHNIQUE: Both transabdominal and transvaginal ultrasound examinations of the pelvis were performed. Transabdominal technique was performed for global imaging of the pelvis including uterus, ovaries, adnexal regions, and pelvic cul-de-sac. It was necessary to proceed with endovaginal exam following the transabdominal exam to visualize the endometrium and ovaries. Color and duplex Doppler ultrasound was utilized to evaluate blood flow to the ovaries. COMPARISON:  None Available. FINDINGS: Uterus Measurements: 16.3 x 8.4 x 12.4 cm = volume: 890 mL. The uterus is enlarged and myomatous. Multiple fibroids including a 4.1 x 3.8 x 3.9 cm right fundal intramural fibroid, a 6.4 x 5.7 x 6.0 cm left anterior intramural fibroid, and a 5.1 x 4.2 x 4.0 cm left anterior lower uterine intramural fibroid. Endometrium Thickness: 7 mm. The endometrium is poorly visualized and suboptimally evaluated. Right ovary Measurements: 7.0 x 3.2 x 3.7 cm = volume: 44 mL.  Probable small complex/hemorrhagic dominant follicle or corpus luteum. Doppler images demonstrate arterial and venous flow the right ovary. Left ovary Not visualized. Other findings No abnormal free fluid. IMPRESSION: 1. Enlarged myomatous uterus. 2. Grossly unremarkable right ovary. 3. Nonvisualization of the left ovary and poorly visualized endometrium. Electronically Signed   By: Anner Crete M.D.   On: 08/23/2022 02:01   [place] Art/Ven Flow Abd Pelv Doppler  Result Date: 08/23/2022 CLINICAL DATA:  Right lower quadrant pelvic pain. EXAM: TRANSABDOMINAL AND TRANSVAGINAL ULTRASOUND OF PELVIS DOPPLER ULTRASOUND OF OVARIES TECHNIQUE: Both transabdominal and transvaginal ultrasound examinations of the pelvis were performed. Transabdominal technique was performed for global imaging of the pelvis including uterus, ovaries, adnexal regions, and pelvic cul-de-sac. It was necessary to proceed with endovaginal exam following the transabdominal exam to visualize the endometrium and ovaries. Color and duplex Doppler ultrasound was utilized to evaluate blood flow to the ovaries. COMPARISON:  None Available. FINDINGS: Uterus Measurements: 16.3 x 8.4 x 12.4 cm = volume: 890 mL. The uterus is enlarged and myomatous. Multiple fibroids including a 4.1 x 3.8 x 3.9 cm right fundal intramural fibroid, a 6.4 x 5.7 x 6.0 cm left anterior intramural fibroid, and a 5.1 x 4.2 x 4.0 cm left anterior lower uterine intramural fibroid. Endometrium Thickness: 7 mm. The endometrium is poorly visualized and suboptimally evaluated. Right ovary Measurements: 7.0 x 3.2 x 3.7 cm = volume: 44 mL. Probable small complex/hemorrhagic dominant follicle or corpus luteum. Doppler images demonstrate arterial and venous flow the right ovary. Left ovary Not visualized. Other findings No abnormal free fluid. IMPRESSION: 1. Enlarged myomatous uterus. 2. Grossly unremarkable right ovary. 3. Nonvisualization of the left ovary and poorly visualized endometrium.  Electronically Signed   By: Anner Crete M.D.   On: 08/23/2022 02:01     Data Reviewed: Relevant notes from primary care and specialist visits, past discharge summaries as available in EHR, including Care Everywhere. Prior diagnostic testing as pertinent to current admission diagnoses Updated medications and problem lists for reconciliation ED course, including vitals, labs, imaging, treatment and response to treatment Triage notes, nursing and pharmacy notes and ED provider's notes Notable results as noted in HPI   Assessment and Plan: * Symptomatic anemia Hypermenorrhea/menorrhagia Fibroids, endometriosis Patient symptomatic for fatigue and lightheadedness Hemoglobin 5.2 Continue transfusion of 2 units PRBCs initiated in the emergency room GYN referral Age-appropriate colon cancer screening  Right lower quadrant pain CT scan with no acute findings Possibly related to ruptured corpus luteum seen on CT Has a history of endometriosis but not currently menstruating (last cycle 8/11 to 8/18) Diet as tolerated, antiemetic  and supportive care Stool for occult blood and age-appropriate colon cancer screening        DVT prophylaxis: none  Consults: none  Advance Care Planning:   Family Communication: none  Disposition Plan: Back to previous home environment  Severity of Illness: The appropriate patient status for this patient is OBSERVATION. Observation status is judged to be reasonable and necessary in order to provide the required intensity of service to ensure the patient's safety. The patient's presenting symptoms, physical exam findings, and initial radiographic and laboratory data in the context of their medical condition is felt to place them at decreased risk for further clinical deterioration. Furthermore, it is anticipated that the patient will be medically stable for discharge from the hospital within 2 midnights of admission.   Author: Athena Masse,  MD 08/23/2022 5:12 AM  For on call review [email or web address].

## 2022-08-23 NOTE — Assessment & Plan Note (Addendum)
CT scan with no acute findings Possibly related to ruptured corpus luteum seen on CT Has a history of endometriosis but not currently menstruating (last cycle 8/11 to 8/18) Diet as tolerated, antiemetic and supportive care Stool for occult blood and age-appropriate colon cancer screening

## 2022-08-23 NOTE — Hospital Course (Addendum)
46 y.o.f w/ hx of Fibroids and endometriosis presented to the ED with a 2-day history of right lower quadrant pain associated with nausea and an episode of nonbloody, nonbilious emesis.She reports heavy menstrual bleeding monthly with last cycle 8/11 to 8/18. SN:KNLZJ work significant for hemoglobin of 5.2.Most recent hemoglobin on record 6.1 a year ago Patient had CT abdomen and pelvis as well as pelvic ultrasound that showed the following findings: [place]:  IMPRESSION: Enlarged myomatous uterus.Grossly unremarkable right ovary. Nonvisualization of the left ovary and poorly visualized endometrium. CT:  IMPRESSION: No acute intra-abdominal pathology identified. No definite radiographic explanation for the patient's reported symptoms. Normal appendix. Enlarged fibroid uterus measuring up to 18.2 cm in greatest dimension.Trace free intraperitoneal fluid within the right adnexa tracking into the right pericolic gutter, possibly related to a ruptured corpus luteum.Patient was admitted 2 units PRBC ordered. Her hb improved with transfusion. She is hemodynamically stable.  We discussed about need for outpatient follow-up with gynecology, started on iron supplement.  Also needs routine follow-up with PCP with CBC check in a week or so.  I have provided information for PCP as well as gynecology for her and TOC was consulted for the same.

## 2022-08-23 NOTE — ED Notes (Signed)
Unsuccessful attempt at IV to L hand, but CBC sample was able to be obtained. IV team consulted, and pt taken to [place] in meantime.

## 2022-08-23 NOTE — Assessment & Plan Note (Addendum)
Hypermenorrhea/menorrhagia Fibroids, endometriosis Patient symptomatic for fatigue and lightheadedness Hemoglobin 5.2 Continue transfusion of 2 units PRBCs initiated in the emergency room GYN referral Age-appropriate colon cancer screening

## 2022-08-23 NOTE — Discharge Summary (Signed)
Physician Discharge Summary  Dawn Lyons IRW:[id number] DOB: [birth year] DOA: 08/22/2022  PCP: Pcp, No  Admit date: 08/22/2022 Discharge date: 08/23/2022 Recommendations for Outpatient Follow-up:  Follow up with PCP in 1 weeks-call for appointment Please obtain BMP/CBC in one week  Discharge Dispo: home Discharge Condition: Stable Code Status:   Code Status: Full Code Diet recommendation:  Diet Order             Diet regular Room service appropriate? Yes; Fluid consistency: Thin  Diet effective now                    Brief/Interim Summary: 46 y.o.f w/ hx of Fibroids and endometriosis presented to the ED with a 2-day history of right lower quadrant pain associated with nausea and an episode of nonbloody, nonbilious emesis.She reports heavy menstrual bleeding monthly with last cycle 8/11 to 8/18. PY:PPJKD work significant for hemoglobin of 5.2.Most recent hemoglobin on record 6.1 a year ago Patient had CT abdomen and pelvis as well as pelvic ultrasound that showed the following findings: [place]:  IMPRESSION: Enlarged myomatous uterus.Grossly unremarkable right ovary. Nonvisualization of the left ovary and poorly visualized endometrium. CT:  IMPRESSION: No acute intra-abdominal pathology identified. No definite radiographic explanation for the patient's reported symptoms. Normal appendix. Enlarged fibroid uterus measuring up to 18.2 cm in greatest dimension.Trace free intraperitoneal fluid within the right adnexa tracking into the right pericolic gutter, possibly related to a ruptured corpus luteum. Patient was admitted 2 units PRBC ordered.  Her hb improved with transfusion. She is hemodynamically stable.  We discussed about need for outpatient follow-up with gynecology, started on iron supplement.  Also needs routine follow-up with PCP with CBC check in a week or so.  I have provided information for PCP as well as gynecology for her and TOC was consulted for the same.  Discharge  Diagnoses:  Principal Problem:   Symptomatic anemia Active Problems:   Endometriosis   Fibroid uterus   Right lower quadrant pain Symptomatic anemia Anemia due to chronic blood loss due to menorrhagia due to fibroids endometriosis Iron deficiency anemia due to chronic blood loss: Patient symptomatic transfusing to unit PRBC H&H 5.2 on admission, check posttransfusion H&H.  Will need outpatient GYN follow-up.  Sending her iron supplement on discharge. Info provided by PCP and gynecology follow-up Recent Labs  Lab 08/23/22 0107 08/23/22 1527  HGB 5.2* 7.1*  HCT 21.2* 25.5*     Right lower quadrant pain Fibroids endometriosis: CT abdomen completed in the ED no acute finding otherwise.  Currently no pain.  Need outpatient follow-up with a stool occult/is appropriate: Screening  Consults: none Subjective: Alert awake oriented resting comfortably.  Pending.  Discharge Exam: Vitals:   08/23/22 1003 08/23/22 1246  BP: 118/70 105/63  Pulse: 83 81  Resp: 18 18  Temp: 98.5 F (36.9 C) 99.2 F (37.3 C)  SpO2: 100% 100%   General: Pt is alert, awake, not in acute distress Cardiovascular: RRR, S1/S2 +, no rubs, no gallops Respiratory: CTA bilaterally, no wheezing, no rhonchi Abdominal: Soft, NT, ND, bowel sounds + Extremities: no edema, no cyanosis  Discharge Instructions  Discharge Instructions     Discharge instructions   Complete by: As directed    You will need to see gynecology. Please call the phone no provided.  Please call call MD or return to ER for similar or worsening recurring problem that brought you to hospital or if any fever,nausea/vomiting,abdominal pain, uncontrolled pain, chest pain,  shortness of breath or  any other alarming symptoms.  Please follow-up your doctor as instructed in a week time and call the office for appointment.  Please avoid alcohol, smoking, or any other illicit substance and maintain healthy habits including taking your regular  medications as prescribed.  You were cared for by a hospitalist during your hospital stay. If you have any questions about your discharge medications or the care you received while you were in the hospital after you are discharged, you can call the unit and ask to speak with the hospitalist on call if the hospitalist that took care of you is not available.  Once you are discharged, your primary care physician will handle any further medical issues. Please note that NO REFILLS for any discharge medications will be authorized once you are discharged, as it is imperative that you return to your primary care physician (or establish a relationship with a primary care physician if you do not have one) for your aftercare needs so that they can reassess your need for medications and monitor your lab values   Increase activity slowly   Complete by: As directed       Allergies as of 08/23/2022   No Known Allergies      Medication List     STOP taking these medications    naproxen sodium 220 MG tablet Commonly known as: ALEVE       TAKE these medications    Clear Eyes Adv Dry & Itchy Rlf 0.25 % Soln Generic drug: Glycerin Apply 2 drops to eye daily as needed (For dry and itchy eyes).   ferrous sulfate 325 (65 FE) MG tablet Take 1 tablet (325 mg total) by mouth 2 (two) times daily with a meal.   folic acid 1 MG tablet Commonly known as: FOLVITE Take 1 tablet (1 mg total) by mouth daily.        Follow-up [facility] [place] Follow up in 1 week(s).   Contact information: [place] [address] [facility] [facility] Follow up.   Why: call office for appointment Contact information: [address]  [address] [place] [address] [phone]               No Known Allergies  The results of significant diagnostics from this  hospitalization (including imaging, microbiology, ancillary and laboratory) are listed below for reference.    Microbiology: No results found for this or any previous visit (from the past 240 hour(s)).  Procedures/Studies: CT ABDOMEN PELVIS W CONTRAST  Result Date: 08/23/2022 CLINICAL DATA:  Right lower quadrant abdominal pain EXAM: CT ABDOMEN AND PELVIS WITH CONTRAST TECHNIQUE: Multidetector CT imaging of the abdomen and pelvis was performed using the standard protocol following bolus administration of intravenous contrast. RADIATION DOSE REDUCTION: This exam was performed according to the departmental dose-optimization program which includes automated exposure control, adjustment of the mA and/or kV according to patient size and/or use of iterative reconstruction technique. CONTRAST:  36m OMNIPAQUE IOHEXOL 300 MG/ML  SOLN COMPARISON:  None Available. FINDINGS: Lower chest: No acute abnormality. Hepatobiliary: No focal liver abnormality is seen. No gallstones, gallbladder wall thickening, or biliary dilatation. Pancreas: Unremarkable Spleen: Unremarkable Adrenals/Urinary Tract: The adrenal glands are unremarkable. The kidneys are normal in size and position. Simple exophytic cortical cyst arises from the lower pole the left kidney. No follow-up imaging is recommended for this lesion. The  kidneys are otherwise unremarkable. The bladder is unremarkable. Stomach/Bowel: The stomach, small bowel, and large bowel are unremarkable. The appendix is normal. A trace amount of free intraperitoneal fluid is seen within the right adnexa tracking into the right pericolic gutter. No free intraperitoneal gas. Vascular/Lymphatic: No significant vascular findings are present. No enlarged abdominal or pelvic lymph nodes. Reproductive: The uterus is enlarged with numerous heterogeneously enhancing intrauterine masses in keeping with innumerable uterine fibroids. The uterus measures at least 10.3 x 13.9 x 18.2 cm (volume = 1360  cm^3) in greatest dimension. Corpus luteum noted within the right ovary. Other: No abdominal wall hernia Musculoskeletal: No acute bone abnormality. No lytic or blastic bone lesion. IMPRESSION: 1. No acute intra-abdominal pathology identified. No definite radiographic explanation for the patient's reported symptoms. Normal appendix. 2. Enlarged fibroid uterus measuring up to 18.2 cm in greatest dimension. 3. Trace free intraperitoneal fluid within the right adnexa tracking into the right pericolic gutter, possibly related to a ruptured corpus luteum. Electronically Signed   By: Fidela Salisbury M.D.   On: 08/23/2022 04:16   US Pelvis Complete  Result Date: 08/23/2022 CLINICAL DATA:  Right lower quadrant pelvic pain. EXAM: TRANSABDOMINAL AND TRANSVAGINAL ULTRASOUND OF PELVIS DOPPLER ULTRASOUND OF OVARIES TECHNIQUE: Both transabdominal and transvaginal ultrasound examinations of the pelvis were performed. Transabdominal technique was performed for global imaging of the pelvis including uterus, ovaries, adnexal regions, and pelvic cul-de-sac. It was necessary to proceed with endovaginal exam following the transabdominal exam to visualize the endometrium and ovaries. Color and duplex Doppler ultrasound was utilized to evaluate blood flow to the ovaries. COMPARISON:  None Available. FINDINGS: Uterus Measurements: 16.3 x 8.4 x 12.4 cm = volume: 890 mL. The uterus is enlarged and myomatous. Multiple fibroids including a 4.1 x 3.8 x 3.9 cm right fundal intramural fibroid, a 6.4 x 5.7 x 6.0 cm left anterior intramural fibroid, and a 5.1 x 4.2 x 4.0 cm left anterior lower uterine intramural fibroid. Endometrium Thickness: 7 mm. The endometrium is poorly visualized and suboptimally evaluated. Right ovary Measurements: 7.0 x 3.2 x 3.7 cm = volume: 44 mL. Probable small complex/hemorrhagic dominant follicle or corpus luteum. Doppler images demonstrate arterial and venous flow the right ovary. Left ovary Not visualized. Other  findings No abnormal free fluid. IMPRESSION: 1. Enlarged myomatous uterus. 2. Grossly unremarkable right ovary. 3. Nonvisualization of the left ovary and poorly visualized endometrium. Electronically Signed   By: Anner Crete M.D.   On: 08/23/2022 02:01   US Transvaginal Non-OB  Result Date: 08/23/2022 CLINICAL DATA:  Right lower quadrant pelvic pain. EXAM: TRANSABDOMINAL AND TRANSVAGINAL ULTRASOUND OF PELVIS DOPPLER ULTRASOUND OF OVARIES TECHNIQUE: Both transabdominal and transvaginal ultrasound examinations of the pelvis were performed. Transabdominal technique was performed for global imaging of the pelvis including uterus, ovaries, adnexal regions, and pelvic cul-de-sac. It was necessary to proceed with endovaginal exam following the transabdominal exam to visualize the endometrium and ovaries. Color and duplex Doppler ultrasound was utilized to evaluate blood flow to the ovaries. COMPARISON:  None Available. FINDINGS: Uterus Measurements: 16.3 x 8.4 x 12.4 cm = volume: 890 mL. The uterus is enlarged and myomatous. Multiple fibroids including a 4.1 x 3.8 x 3.9 cm right fundal intramural fibroid, a 6.4 x 5.7 x 6.0 cm left anterior intramural fibroid, and a 5.1 x 4.2 x 4.0 cm left anterior lower uterine intramural fibroid. Endometrium Thickness: 7 mm. The endometrium is poorly visualized and suboptimally evaluated. Right ovary Measurements: 7.0 x 3.2 x 3.7 cm = volume: 44  mL. Probable small complex/hemorrhagic dominant follicle or corpus luteum. Doppler images demonstrate arterial and venous flow the right ovary. Left ovary Not visualized. Other findings No abnormal free fluid. IMPRESSION: 1. Enlarged myomatous uterus. 2. Grossly unremarkable right ovary. 3. Nonvisualization of the left ovary and poorly visualized endometrium. Electronically Signed   By: Anner Crete M.D.   On: 08/23/2022 02:01   [place] Art/Ven Flow Abd Pelv Doppler  Result Date: 08/23/2022 CLINICAL DATA:  Right lower quadrant pelvic  pain. EXAM: TRANSABDOMINAL AND TRANSVAGINAL ULTRASOUND OF PELVIS DOPPLER ULTRASOUND OF OVARIES TECHNIQUE: Both transabdominal and transvaginal ultrasound examinations of the pelvis were performed. Transabdominal technique was performed for global imaging of the pelvis including uterus, ovaries, adnexal regions, and pelvic cul-de-sac. It was necessary to proceed with endovaginal exam following the transabdominal exam to visualize the endometrium and ovaries. Color and duplex Doppler ultrasound was utilized to evaluate blood flow to the ovaries. COMPARISON:  None Available. FINDINGS: Uterus Measurements: 16.3 x 8.4 x 12.4 cm = volume: 890 mL. The uterus is enlarged and myomatous. Multiple fibroids including a 4.1 x 3.8 x 3.9 cm right fundal intramural fibroid, a 6.4 x 5.7 x 6.0 cm left anterior intramural fibroid, and a 5.1 x 4.2 x 4.0 cm left anterior lower uterine intramural fibroid. Endometrium Thickness: 7 mm. The endometrium is poorly visualized and suboptimally evaluated. Right ovary Measurements: 7.0 x 3.2 x 3.7 cm = volume: 44 mL. Probable small complex/hemorrhagic dominant follicle or corpus luteum. Doppler images demonstrate arterial and venous flow the right ovary. Left ovary Not visualized. Other findings No abnormal free fluid. IMPRESSION: 1. Enlarged myomatous uterus. 2. Grossly unremarkable right ovary. 3. Nonvisualization of the left ovary and poorly visualized endometrium. Electronically Signed   By: Anner Crete M.D.   On: 08/23/2022 02:01    Labs: BNP (last 3 results) No results for input(s): "BNP" in the last 8760 hours. Basic Metabolic Panel: Recent Labs  Lab 08/22/22 2122  NA 139  K 4.4  CL 110  CO2 16*  GLUCOSE 98  BUN 10  CREATININE 0.82  CALCIUM 9.0   Liver Function Tests: Recent Labs  Lab 08/22/22 2122  AST 30  ALT 9  ALKPHOS 59  BILITOT 0.3  PROT 8.3*  ALBUMIN 3.8   Recent Labs  Lab 08/22/22 2122  LIPASE 38   No results for input(s): "AMMONIA" in the  last 168 hours. CBC: Recent Labs  Lab 08/23/22 0107  WBC 7.9  NEUTROABS 5.7  HGB 5.2*  HCT 21.2*  MCV 62.9*  PLT 277   Cardiac Enzymes: No results for input(s): "CKTOTAL", "CKMB", "CKMBINDEX", "TROPONINI" in the last 168 hours. BNP: Invalid input(s): "POCBNP" CBG: No results for input(s): "GLUCAP" in the last 168 hours. D-Dimer No results for input(s): "DDIMER" in the last 72 hours. Hgb A1c No results for input(s): "HGBA1C" in the last 72 hours. Lipid Profile No results for input(s): "CHOL", "HDL", "LDLCALC", "TRIG", "CHOLHDL", "LDLDIRECT" in the last 72 hours. Thyroid function studies No results for input(s): "TSH", "T4TOTAL", "T3FREE", "THYROIDAB" in the last 72 hours.  Invalid input(s): "FREET3" Anemia work up Recent Labs    08/23/22 0330  VITAMINB12 389  FOLATE 10.2  FERRITIN 2*  TIBC 552*  IRON 16*  RETICCTPCT 1.1   Urinalysis    Component Value Date/Time   COLORURINE YELLOW 06/04/2015 2002   APPEARANCEUR TURBID (A) 06/04/2015 2002   LABSPEC >=1.030 08/25/2020 1536   PHURINE 5.5 08/25/2020 [place] 08/25/2020 1536   HGBUR NEGATIVE 08/25/2020  [place] 08/25/2020 [place] 08/25/2020 1536   PROTEINUR NEGATIVE 08/25/2020 1536   UROBILINOGEN 0.2 08/25/2020 1536   NITRITE NEGATIVE 08/25/2020 [place] 08/25/2020 1536   Sepsis Labs Recent Labs  Lab 08/23/22 0107  WBC 7.9   Microbiology No results found for this or any previous visit (from the past 240 hour(s)).   Time coordinating discharge: 25 minutes  SIGNED: Antonieta Pert, MD  [facility] 08/23/2022, 3:17 PM  If 7PM-7AM, please contact night-coverage [email or web address]

## 2022-08-23 NOTE — ED Notes (Signed)
IV team attempting access now at bedside

## 2022-08-23 NOTE — Plan of Care (Signed)
  Problem: Education: Goal: Knowledge of General Education information will improve Description Including pain rating scale, medication(s)/side effects and non-pharmacologic comfort measures Outcome: Progressing   

## 2022-08-24 ENCOUNTER — Other Ambulatory Visit: Payer: Self-pay

## 2022-08-24 ENCOUNTER — Encounter (HOSPITAL_COMMUNITY): Payer: Self-pay

## 2022-08-24 ENCOUNTER — Emergency Department (HOSPITAL_COMMUNITY)
Admission: EM | Admit: 2022-08-24 | Discharge: 2022-08-24 | Discharge status: Against Medical Advice | Payer: Commercial Managed Care - HMO | Attending: Emergency Medicine | Admitting: Emergency Medicine

## 2022-08-24 DIAGNOSIS — Z5321 Procedure and treatment not carried out due to patient leaving prior to being seen by health care provider: Secondary | ICD-10-CM | POA: Diagnosis not present

## 2022-08-24 DIAGNOSIS — R1011 Right upper quadrant pain: Secondary | ICD-10-CM | POA: Insufficient documentation

## 2022-08-24 LAB — BPAM RBC
Blood Product Expiration Date: 202308282359
Blood Product Expiration Date: 202308292359
ISSUE DATE / TIME: 202308240512
ISSUE DATE / TIME: 202308240933
Unit Type and Rh: 600
Unit Type and Rh: 600

## 2022-08-24 LAB — TYPE AND SCREEN
ABO/RH(D): AB NEG
Antibody Screen: NEGATIVE
Unit division: 0
Unit division: 0

## 2022-08-24 LAB — COMPREHENSIVE METABOLIC PANEL
ALT: 8 U/L (ref 0–44)
AST: 18 U/L (ref 15–41)
Albumin: 3.5 g/dL (ref 3.5–5.0)
Alkaline Phosphatase: 57 U/L (ref 38–126)
Anion gap: 6 (ref 5–15)
BUN: 8 mg/dL (ref 6–20)
CO2: 21 mmol/L — ABNORMAL LOW (ref 22–32)
Calcium: 9 mg/dL (ref 8.9–10.3)
Chloride: 109 mmol/L (ref 98–111)
Creatinine, Ser: 0.86 mg/dL (ref 0.44–1.00)
GFR, Estimated: >60 mL/min (ref >60)
Glucose, Bld: 100 mg/dL — ABNORMAL HIGH (ref 70–99)
Potassium: 3.9 mmol/L (ref 3.5–5.1)
Sodium: 136 mmol/L (ref 135–145)
Total Bilirubin: 0.6 mg/dL (ref 0.3–1.2)
Total Protein: 7.8 g/dL (ref 6.5–8.1)

## 2022-08-24 LAB — URINALYSIS, ROUTINE W REFLEX MICROSCOPIC
Bilirubin Urine: NEGATIVE
Glucose, UA: NEGATIVE mg/dL
Hgb urine dipstick: NEGATIVE
Ketones, ur: NEGATIVE mg/dL
Leukocytes,Ua: NEGATIVE
Nitrite: NEGATIVE
Protein, ur: NEGATIVE mg/dL
Specific Gravity, Urine: 1.024 (ref 1.005–1.030)
pH: 5 (ref 5.0–8.0)

## 2022-08-24 LAB — CBC WITH DIFFERENTIAL/PLATELET
Abs Immature Granulocytes: 0.03 10*3/uL (ref 0.00–0.07)
Basophils Absolute: 0 10*3/uL (ref 0.0–0.1)
Basophils Relative: 1 %
Eosinophils Absolute: 0 10*3/uL (ref 0.0–0.5)
Eosinophils Relative: 0 %
HCT: 26.6 % — ABNORMAL LOW (ref 36.0–46.0)
Hemoglobin: 7.6 g/dL — ABNORMAL LOW (ref 12.0–15.0)
Immature Granulocytes: 0 %
Lymphocytes Relative: 29 %
Lymphs Abs: 2.2 10*3/uL (ref 0.7–4.0)
MCH: 19 pg — ABNORMAL LOW (ref 26.0–34.0)
MCHC: 28.6 g/dL — ABNORMAL LOW (ref 30.0–36.0)
MCV: 66.3 fL — ABNORMAL LOW (ref 80.0–100.0)
Monocytes Absolute: 0.5 10*3/uL (ref 0.1–1.0)
Monocytes Relative: 6 %
Neutro Abs: 4.8 10*3/uL (ref 1.7–7.7)
Neutrophils Relative %: 64 %
Platelets: 237 10*3/uL (ref 150–400)
RBC: 4.01 MIL/uL (ref 3.87–5.11)
RDW: 25.6 % — ABNORMAL HIGH (ref 11.5–15.5)
WBC: 7.6 10*3/uL (ref 4.0–10.5)
nRBC: 0.3 % — ABNORMAL HIGH (ref 0.0–0.2)

## 2022-08-24 LAB — LIPASE, BLOOD: Lipase: 53 U/L — ABNORMAL HIGH (ref 11–51)

## 2022-08-24 NOTE — ED Triage Notes (Signed)
Pt arrived POV after receiving a blood transfusion. Pt states after it was finished she had a sharp pain on her RUQ and wanted to be checked out.

## 2022-08-24 NOTE — ED Provider Triage Note (Signed)
Emergency Medicine Provider Triage Evaluation Note  Dawn Lyons , a 46 y.o. female  was evaluated in triage.  Pt complains of right upper quadrant abdominal pain onset PTA. No meds tried. Pt denies nausea, vomiting, fever, urinary symptoms.  Review of Systems  Positive: As per HPI Negative:   Physical Exam  BP 130/89 (BP Location: Right Arm)   Pulse 91   Temp 98.9 F (37.2 C) (Oral)   Resp 16   LMP 08/10/2022 (Exact Date)   SpO2 100%  Gen:   Awake, no distress   Resp:  Normal effort  MSK:   Moves extremities without difficulty  Other:  RLQ and suprapubic TTP.   Medical Decision Making  Medically screening exam initiated at 2:54 PM.  Appropriate orders placed.  Dawn Lyons was informed that the remainder of the evaluation will be completed by another provider, this initial triage assessment does not replace that evaluation, and the importance of remaining in the ED until their evaluation is complete.  Work-up initiated   Hulen Mandler A, PA-C 08/24/22 1500

## 2022-08-24 NOTE — ED Notes (Signed)
Pt stated she was leaving AMA due to wait time

## 2022-08-25 ENCOUNTER — Encounter (HOSPITAL_COMMUNITY): Payer: Self-pay

## 2022-08-25 ENCOUNTER — Emergency Department (HOSPITAL_COMMUNITY)
Admission: EM | Admit: 2022-08-25 | Discharge: 2022-08-26 | Disposition: A | Discharge status: Home / Self Care | Payer: Commercial Managed Care - HMO | Attending: Emergency Medicine | Admitting: Emergency Medicine

## 2022-08-25 ENCOUNTER — Other Ambulatory Visit: Payer: Self-pay

## 2022-08-25 DIAGNOSIS — D259 Leiomyoma of uterus, unspecified: Secondary | ICD-10-CM | POA: Diagnosis not present

## 2022-08-25 DIAGNOSIS — D219 Benign neoplasm of connective and other soft tissue, unspecified: Secondary | ICD-10-CM

## 2022-08-25 DIAGNOSIS — R102 Pelvic and perineal pain: Secondary | ICD-10-CM | POA: Diagnosis present

## 2022-08-25 DIAGNOSIS — D649 Anemia, unspecified: Secondary | ICD-10-CM | POA: Diagnosis not present

## 2022-08-25 LAB — CBC WITH DIFFERENTIAL/PLATELET
Abs Immature Granulocytes: 0.1 10*3/uL — ABNORMAL HIGH (ref 0.00–0.07)
Basophils Absolute: 0 10*3/uL (ref 0.0–0.1)
Basophils Relative: 1 %
Eosinophils Absolute: 0 10*3/uL (ref 0.0–0.5)
Eosinophils Relative: 0 %
HCT: 29.2 % — ABNORMAL LOW (ref 36.0–46.0)
Hemoglobin: 8 g/dL — ABNORMAL LOW (ref 12.0–15.0)
Immature Granulocytes: 1 %
Lymphocytes Relative: 28 %
Lymphs Abs: 2.1 10*3/uL (ref 0.7–4.0)
MCH: 18.9 pg — ABNORMAL LOW (ref 26.0–34.0)
MCHC: 27.4 g/dL — ABNORMAL LOW (ref 30.0–36.0)
MCV: 69 fL — ABNORMAL LOW (ref 80.0–100.0)
Monocytes Absolute: 0.4 10*3/uL (ref 0.1–1.0)
Monocytes Relative: 5 %
Neutro Abs: 4.7 10*3/uL (ref 1.7–7.7)
Neutrophils Relative %: 65 %
Platelets: 242 10*3/uL (ref 150–400)
RBC: 4.23 MIL/uL (ref 3.87–5.11)
RDW: 27.4 % — ABNORMAL HIGH (ref 11.5–15.5)
WBC: 7.4 10*3/uL (ref 4.0–10.5)
nRBC: 0.5 % — ABNORMAL HIGH (ref 0.0–0.2)

## 2022-08-25 LAB — COMPREHENSIVE METABOLIC PANEL
ALT: 9 U/L (ref 0–44)
AST: 17 U/L (ref 15–41)
Albumin: 3.7 g/dL (ref 3.5–5.0)
Alkaline Phosphatase: 55 U/L (ref 38–126)
Anion gap: 10 (ref 5–15)
BUN: 7 mg/dL (ref 6–20)
CO2: 18 mmol/L — ABNORMAL LOW (ref 22–32)
Calcium: 8.8 mg/dL — ABNORMAL LOW (ref 8.9–10.3)
Chloride: 111 mmol/L (ref 98–111)
Creatinine, Ser: 0.79 mg/dL (ref 0.44–1.00)
GFR, Estimated: >60 mL/min (ref >60)
Glucose, Bld: 102 mg/dL — ABNORMAL HIGH (ref 70–99)
Potassium: 3.2 mmol/L — ABNORMAL LOW (ref 3.5–5.1)
Sodium: 139 mmol/L (ref 135–145)
Total Bilirubin: 0.6 mg/dL (ref 0.3–1.2)
Total Protein: 7.8 g/dL (ref 6.5–8.1)

## 2022-08-25 LAB — LIPASE, BLOOD: Lipase: 41 U/L (ref 11–51)

## 2022-08-25 NOTE — ED Triage Notes (Signed)
Pt arrives with c/o right sided ABD pain that has been ongoing for the last couple of weeks. Pt endorses nausea and dry heaving.

## 2022-08-25 NOTE — ED Provider Triage Note (Signed)
Emergency Medicine Provider Triage Evaluation Note  Dawn Lyons , a 46 y.o. female  was evaluated in triage.  Pt complains of abdominal pain for numerous weeks. Recently admitted and discharged on 8/24 due to anemia requiring blood transfusions. Recent CT and pelvic US. US showed large fibroid uterus.  Review of Systems  Positive: Abdominal pain Negative: fever  Physical Exam  BP 135/84 (BP Location: Right Arm)   Pulse 80   Temp 99.3 F (37.4 C) (Oral)   Resp 16   Ht '5\' 2"'$  (1.575 m)   Wt 83.9 kg   LMP 08/10/2022 (Exact Date)   SpO2 100%   BMI 33.84 kg/m  Gen:   Awake, no distress   Resp:  Normal effort  MSK:   Moves extremities without difficulty  Other:  Diffuse tenderness  Medical Decision Making  Medically screening exam initiated at 5:40 PM.  Appropriate orders placed.  Dawn Lyons was informed that the remainder of the evaluation will be completed by another provider, this initial triage assessment does not replace that evaluation, and the importance of remaining in the ED until their evaluation is complete.  Repeat abdominal labs   Dawn Lyons 08/25/22 4259

## 2022-08-26 MED ORDER — ACETAMINOPHEN 500 MG PO TABS
1000.0000 mg | ORAL_TABLET | Freq: Once | ORAL | Status: AC
Start: 2022-08-26 — End: 2022-08-26
  Administered 2022-08-26: 1000 mg via ORAL
  Filled 2022-08-26: qty 2

## 2022-08-26 MED ORDER — FERROUS SULFATE 325 (65 FE) MG PO TABS: 325.0000 mg | ORAL_TABLET | Freq: Three times a day (TID) | ORAL | 0 refills | Status: AC

## 2022-08-26 MED ORDER — KETOROLAC TROMETHAMINE 60 MG/2ML IM SOLN
60.0000 mg | Freq: Once | INTRAMUSCULAR | Status: AC
  Administered 2022-08-26: 60 mg via INTRAMUSCULAR
  Filled 2022-08-26: qty 2

## 2022-08-26 MED ORDER — MELOXICAM 7.5 MG PO TABS: 7.5000 mg | ORAL_TABLET | Freq: Every day | ORAL | 0 refills | Status: DC

## 2022-08-26 MED ORDER — FERROUS SULFATE 325 (65 FE) MG PO TABS: 325.0000 mg | ORAL_TABLET | Freq: Three times a day (TID) | ORAL | 0 refills | Status: DC

## 2022-08-26 NOTE — ED Notes (Signed)
Patient asked for work note, work note provided for 2 days. Pt states " I need work note until Wednesday" explained that the ER dr only prescribed her for 2 days and she will have to f/u with primary and obgyn. Pt throws note in floor and says push me out

## 2022-08-26 NOTE — ED Provider Notes (Signed)
[facility] DEPT Provider Note   CSN: [id number] Arrival date & time: 08/25/22  1719     History  Chief Complaint  Patient presents with   Abdominal Pain    Dawn Lyons is a 46 y.o. female.  The history is provided by the patient.  Abdominal Pain Pain location: pelvic. Pain quality: not aching   Chronicity:  New Context: not alcohol use   Relieved by:  Nothing Worsened by:  Nothing Ineffective treatments:  None tried Associated symptoms: no constipation, no fever and no vomiting   Risk factors: has not had multiple surgeries        Home Medications Prior to Admission medications   Medication Sig Start Date End Date Taking? Authorizing Provider  ferrous sulfate 325 (65 FE) MG tablet Take 1 tablet (325 mg total) by mouth 3 (three) times daily with meals. 08/26/22  Yes Claire Dolores, MD  ferrous sulfate 325 (65 FE) MG tablet Take 1 tablet (325 mg total) by mouth 3 (three) times daily with meals. 08/26/22 09/25/22 Yes Lajune Perine, MD  folic acid (FOLVITE) 1 MG tablet Take 1 tablet (1 mg total) by mouth daily. 08/23/22 09/22/22 Yes Antonieta Pert, MD  meloxicam (MOBIC) 7.5 MG tablet Take 1 tablet (7.5 mg total) by mouth daily. 08/26/22  Yes Adedamola Seto, MD      Allergies    Patient has no known allergies.    Review of Systems   Review of Systems  Constitutional:  Negative for fever.  HENT:  Negative for facial swelling.   Eyes:  Negative for redness.  Respiratory:  Negative for wheezing and stridor.   Gastrointestinal:  Positive for abdominal pain. Negative for constipation and vomiting.  All other systems reviewed and are negative.   Physical Exam Updated Vital Signs BP (!) 170/92   Pulse 78   Temp 99.3 F (37.4 C)   Resp 20   Ht '5\' 2"'$  (1.575 m)   Wt 83.9 kg   LMP 08/10/2022 (Exact Date)   SpO2 100%   BMI 33.84 kg/m  Physical Exam Vitals and nursing note reviewed.  Constitutional:      General: She is not in acute  distress.    Appearance: Normal appearance. She is well-developed.  HENT:     Head: Normocephalic and atraumatic.     Nose: Nose normal.  Eyes:     Pupils: Pupils are equal, round, and reactive to light.  Cardiovascular:     Rate and Rhythm: Normal rate and regular rhythm.     Pulses: Normal pulses.     Heart sounds: Normal heart sounds.  Pulmonary:     Effort: Pulmonary effort is normal. No respiratory distress.     Breath sounds: Normal breath sounds.  Abdominal:     General: Bowel sounds are normal. There is no distension.     Palpations: Abdomen is soft.     Tenderness: There is no abdominal tenderness. There is no guarding or rebound.  Genitourinary:    Vagina: No vaginal discharge.  Musculoskeletal:        General: Normal range of motion.     Cervical back: Neck supple.  Skin:    General: Skin is warm and dry.     Capillary Refill: Capillary refill takes less than 2 seconds.     Findings: No erythema or rash.  Neurological:     General: No focal deficit present.     Mental Status: She is alert and oriented to person, place, and  time.     Deep Tendon Reflexes: Reflexes normal.  Psychiatric:        Mood and Affect: Mood normal.     ED Results / Procedures / Treatments   Labs (all labs ordered are listed, but only abnormal results are displayed) Results for orders placed or performed during the hospital encounter of 08/25/22  CBC with Differential  Result Value Ref Range   WBC 7.4 4.0 - 10.5 K/uL   RBC 4.23 3.87 - 5.11 MIL/uL   Hemoglobin 8.0 (L) 12.0 - 15.0 g/dL   HCT 29.2 (L) 36.0 - 46.0 %   MCV 69.0 (L) 80.0 - 100.0 fL   MCH 18.9 (L) 26.0 - 34.0 pg   MCHC 27.4 (L) 30.0 - 36.0 g/dL   RDW 27.4 (H) 11.5 - 15.5 %   Platelets 242 150 - 400 K/uL   nRBC 0.5 (H) 0.0 - 0.2 %   Neutrophils Relative % 65 %   Neutro Abs 4.7 1.7 - 7.7 K/uL   Lymphocytes Relative 28 %   Lymphs Abs 2.1 0.7 - 4.0 K/uL   Monocytes Relative 5 %   Monocytes Absolute 0.4 0.1 - 1.0 K/uL    Eosinophils Relative 0 %   Eosinophils Absolute 0.0 0.0 - 0.5 K/uL   Basophils Relative 1 %   Basophils Absolute 0.0 0.0 - 0.1 K/uL   Immature Granulocytes 1 %   Abs Immature Granulocytes 0.10 (H) 0.00 - 0.07 K/uL   Dimorphism PRESENT    Target Cells PRESENT    Ovalocytes PRESENT   Comprehensive metabolic panel  Result Value Ref Range   Sodium 139 135 - 145 mmol/L   Potassium 3.2 (L) 3.5 - 5.1 mmol/L   Chloride 111 98 - 111 mmol/L   CO2 18 (L) 22 - 32 mmol/L   Glucose, Bld 102 (H) 70 - 99 mg/dL   BUN 7 6 - 20 mg/dL   Creatinine, Ser 0.79 0.44 - 1.00 mg/dL   Calcium 8.8 (L) 8.9 - 10.3 mg/dL   Total Protein 7.8 6.5 - 8.1 g/dL   Albumin 3.7 3.5 - 5.0 g/dL   AST 17 15 - 41 U/L   ALT 9 0 - 44 U/L   Alkaline Phosphatase 55 38 - 126 U/L   Total Bilirubin 0.6 0.3 - 1.2 mg/dL   GFR, Estimated >60 >60 mL/min   Anion gap 10 5 - 15  Lipase, blood  Result Value Ref Range   Lipase 41 11 - 51 U/L   CT ABDOMEN PELVIS W CONTRAST  Result Date: 08/23/2022 CLINICAL DATA:  Right lower quadrant abdominal pain EXAM: CT ABDOMEN AND PELVIS WITH CONTRAST TECHNIQUE: Multidetector CT imaging of the abdomen and pelvis was performed using the standard protocol following bolus administration of intravenous contrast. RADIATION DOSE REDUCTION: This exam was performed according to the departmental dose-optimization program which includes automated exposure control, adjustment of the mA and/or kV according to patient size and/or use of iterative reconstruction technique. CONTRAST:  39m OMNIPAQUE IOHEXOL 300 MG/ML  SOLN COMPARISON:  None Available. FINDINGS: Lower chest: No acute abnormality. Hepatobiliary: No focal liver abnormality is seen. No gallstones, gallbladder wall thickening, or biliary dilatation. Pancreas: Unremarkable Spleen: Unremarkable Adrenals/Urinary Tract: The adrenal glands are unremarkable. The kidneys are normal in size and position. Simple exophytic cortical cyst arises from the lower pole the  left kidney. No follow-up imaging is recommended for this lesion. The kidneys are otherwise unremarkable. The bladder is unremarkable. Stomach/Bowel: The stomach, small bowel, and large bowel are  unremarkable. The appendix is normal. A trace amount of free intraperitoneal fluid is seen within the right adnexa tracking into the right pericolic gutter. No free intraperitoneal gas. Vascular/Lymphatic: No significant vascular findings are present. No enlarged abdominal or pelvic lymph nodes. Reproductive: The uterus is enlarged with numerous heterogeneously enhancing intrauterine masses in keeping with innumerable uterine fibroids. The uterus measures at least 10.3 x 13.9 x 18.2 cm (volume = 1360 cm^3) in greatest dimension. Corpus luteum noted within the right ovary. Other: No abdominal wall hernia Musculoskeletal: No acute bone abnormality. No lytic or blastic bone lesion. IMPRESSION: 1. No acute intra-abdominal pathology identified. No definite radiographic explanation for the patient's reported symptoms. Normal appendix. 2. Enlarged fibroid uterus measuring up to 18.2 cm in greatest dimension. 3. Trace free intraperitoneal fluid within the right adnexa tracking into the right pericolic gutter, possibly related to a ruptured corpus luteum. Electronically Signed   By: Fidela Salisbury M.D.   On: 08/23/2022 04:16   US Pelvis Complete  Result Date: 08/23/2022 CLINICAL DATA:  Right lower quadrant pelvic pain. EXAM: TRANSABDOMINAL AND TRANSVAGINAL ULTRASOUND OF PELVIS DOPPLER ULTRASOUND OF OVARIES TECHNIQUE: Both transabdominal and transvaginal ultrasound examinations of the pelvis were performed. Transabdominal technique was performed for global imaging of the pelvis including uterus, ovaries, adnexal regions, and pelvic cul-de-sac. It was necessary to proceed with endovaginal exam following the transabdominal exam to visualize the endometrium and ovaries. Color and duplex Doppler ultrasound was utilized to evaluate  blood flow to the ovaries. COMPARISON:  None Available. FINDINGS: Uterus Measurements: 16.3 x 8.4 x 12.4 cm = volume: 890 mL. The uterus is enlarged and myomatous. Multiple fibroids including a 4.1 x 3.8 x 3.9 cm right fundal intramural fibroid, a 6.4 x 5.7 x 6.0 cm left anterior intramural fibroid, and a 5.1 x 4.2 x 4.0 cm left anterior lower uterine intramural fibroid. Endometrium Thickness: 7 mm. The endometrium is poorly visualized and suboptimally evaluated. Right ovary Measurements: 7.0 x 3.2 x 3.7 cm = volume: 44 mL. Probable small complex/hemorrhagic dominant follicle or corpus luteum. Doppler images demonstrate arterial and venous flow the right ovary. Left ovary Not visualized. Other findings No abnormal free fluid. IMPRESSION: 1. Enlarged myomatous uterus. 2. Grossly unremarkable right ovary. 3. Nonvisualization of the left ovary and poorly visualized endometrium. Electronically Signed   By: Anner Crete M.D.   On: 08/23/2022 02:01   US Transvaginal Non-OB  Result Date: 08/23/2022 CLINICAL DATA:  Right lower quadrant pelvic pain. EXAM: TRANSABDOMINAL AND TRANSVAGINAL ULTRASOUND OF PELVIS DOPPLER ULTRASOUND OF OVARIES TECHNIQUE: Both transabdominal and transvaginal ultrasound examinations of the pelvis were performed. Transabdominal technique was performed for global imaging of the pelvis including uterus, ovaries, adnexal regions, and pelvic cul-de-sac. It was necessary to proceed with endovaginal exam following the transabdominal exam to visualize the endometrium and ovaries. Color and duplex Doppler ultrasound was utilized to evaluate blood flow to the ovaries. COMPARISON:  None Available. FINDINGS: Uterus Measurements: 16.3 x 8.4 x 12.4 cm = volume: 890 mL. The uterus is enlarged and myomatous. Multiple fibroids including a 4.1 x 3.8 x 3.9 cm right fundal intramural fibroid, a 6.4 x 5.7 x 6.0 cm left anterior intramural fibroid, and a 5.1 x 4.2 x 4.0 cm left anterior lower uterine intramural  fibroid. Endometrium Thickness: 7 mm. The endometrium is poorly visualized and suboptimally evaluated. Right ovary Measurements: 7.0 x 3.2 x 3.7 cm = volume: 44 mL. Probable small complex/hemorrhagic dominant follicle or corpus luteum. Doppler images demonstrate arterial and venous flow the  right ovary. Left ovary Not visualized. Other findings No abnormal free fluid. IMPRESSION: 1. Enlarged myomatous uterus. 2. Grossly unremarkable right ovary. 3. Nonvisualization of the left ovary and poorly visualized endometrium. Electronically Signed   By: Anner Crete M.D.   On: 08/23/2022 02:01   [place] Art/Ven Flow Abd Pelv Doppler  Result Date: 08/23/2022 CLINICAL DATA:  Right lower quadrant pelvic pain. EXAM: TRANSABDOMINAL AND TRANSVAGINAL ULTRASOUND OF PELVIS DOPPLER ULTRASOUND OF OVARIES TECHNIQUE: Both transabdominal and transvaginal ultrasound examinations of the pelvis were performed. Transabdominal technique was performed for global imaging of the pelvis including uterus, ovaries, adnexal regions, and pelvic cul-de-sac. It was necessary to proceed with endovaginal exam following the transabdominal exam to visualize the endometrium and ovaries. Color and duplex Doppler ultrasound was utilized to evaluate blood flow to the ovaries. COMPARISON:  None Available. FINDINGS: Uterus Measurements: 16.3 x 8.4 x 12.4 cm = volume: 890 mL. The uterus is enlarged and myomatous. Multiple fibroids including a 4.1 x 3.8 x 3.9 cm right fundal intramural fibroid, a 6.4 x 5.7 x 6.0 cm left anterior intramural fibroid, and a 5.1 x 4.2 x 4.0 cm left anterior lower uterine intramural fibroid. Endometrium Thickness: 7 mm. The endometrium is poorly visualized and suboptimally evaluated. Right ovary Measurements: 7.0 x 3.2 x 3.7 cm = volume: 44 mL. Probable small complex/hemorrhagic dominant follicle or corpus luteum. Doppler images demonstrate arterial and venous flow the right ovary. Left ovary Not visualized. Other findings No  abnormal free fluid. IMPRESSION: 1. Enlarged myomatous uterus. 2. Grossly unremarkable right ovary. 3. Nonvisualization of the left ovary and poorly visualized endometrium. Electronically Signed   By: Anner Crete M.D.   On: 08/23/2022 02:01      Radiology No results found.  Procedures Procedures    Medications Ordered in ED Medications  ketorolac (TORADOL) injection 60 mg (60 mg Intramuscular Given 08/26/22 0339)  acetaminophen (TYLENOL) tablet 1,000 mg (1,000 mg Oral Given 08/26/22 5573)    ED Course/ Medical Decision Making/ A&P                           Medical Decision Making Patient with ruptured cyst and fibroids presents with ongoing pain having not taken anything for her pain.    Amount and/or Complexity of Data Reviewed External Data Reviewed: notes.    Details: Previous admission notes reviewed  Labs: ordered.    Details: All labs reviewed:  normal without 7.4, hemoglobin low 8, 242K.  Normal sodium 139 and creatinine.79 normal LFTs, normal Lipase   Risk OTC drugs. Prescription drug management. Risk Details: Patient has not taken pain medicine.  Not yet followed up with GYN.  Call Monday for follow    aking why or why not admission, treatments were needed:1} Final Clinical Impression(s) / ED Diagnoses Final diagnoses:  Fibroids  Return for intractable cough, coughing up blood, fevers > 100.4 unrelieved by medication, shortness of breath, intractable vomiting, chest pain, shortness of breath, weakness, numbness, changes in speech, facial asymmetry, abdominal pain, passing out, Inability to tolerate liquids or food, cough, altered mental status or any concerns. No signs of systemic illness or infection. The patient is nontoxic-appearing on exam and vital signs are within normal limits.  I have reviewed the triage vital signs and the nursing notes. Pertinent labs & imaging results that were available during my care of the patient were reviewed by me and considered  in my medical decision making (see chart for details). After history, exam,  and medical workup I feel the patient has been appropriately medically screened and is safe for discharge home. Pertinent diagnoses were discussed with the patient. Patient was given return precautions.   Rx / DC Orders ED Discharge Orders          Ordered    ferrous sulfate 325 (65 FE) MG tablet  3 times daily with meals        08/26/22 0331    ferrous sulfate 325 (65 FE) MG tablet  3 times daily with meals        08/26/22 0331    meloxicam (MOBIC) 7.5 MG tablet  Daily        08/26/22 [place], Henlee Donovan, MD 08/26/22 0086

## 2022-09-10 ENCOUNTER — Ambulatory Visit (INDEPENDENT_AMBULATORY_CARE_PROVIDER_SITE_OTHER): Payer: Commercial Managed Care - HMO | Admitting: Obstetrics and Gynecology

## 2022-09-10 ENCOUNTER — Other Ambulatory Visit (HOSPITAL_COMMUNITY)
Admission: RE | Admit: 2022-09-10 | Discharge: 2022-09-10 | Disposition: A | Discharge status: Home / Self Care | Payer: Commercial Managed Care - HMO | Source: Ambulatory Visit | Attending: Obstetrics and Gynecology | Admitting: Obstetrics and Gynecology

## 2022-09-10 ENCOUNTER — Encounter: Payer: Self-pay | Admitting: Obstetrics and Gynecology

## 2022-09-10 VITALS — BP 100/72 | HR 89 | Ht 64.0 in | Wt 161.0 lb

## 2022-09-10 DIAGNOSIS — Z113 Encounter for screening for infections with a predominantly sexual mode of transmission: Secondary | ICD-10-CM | POA: Diagnosis present

## 2022-09-10 DIAGNOSIS — Z1159 Encounter for screening for other viral diseases: Secondary | ICD-10-CM | POA: Diagnosis not present

## 2022-09-10 DIAGNOSIS — N920 Excessive and frequent menstruation with regular cycle: Secondary | ICD-10-CM | POA: Diagnosis not present

## 2022-09-10 DIAGNOSIS — Z124 Encounter for screening for malignant neoplasm of cervix: Secondary | ICD-10-CM | POA: Insufficient documentation

## 2022-09-10 DIAGNOSIS — Z114 Encounter for screening for human immunodeficiency virus [HIV]: Secondary | ICD-10-CM

## 2022-09-10 DIAGNOSIS — D219 Benign neoplasm of connective and other soft tissue, unspecified: Secondary | ICD-10-CM

## 2022-09-10 DIAGNOSIS — N760 Acute vaginitis: Secondary | ICD-10-CM

## 2022-09-10 NOTE — Patient Instructions (Signed)
Total Laparoscopic Hysterectomy A total laparoscopic hysterectomy is a minimally invasive surgery to remove the uterus and cervix. The fallopian tubes and ovaries can also be removed during this surgery, if necessary. This procedure may be done to treat problems such as: Growths in the uterus (uterine fibroids) that are not cancer but cause symptoms. A condition that causes the lining of the uterus to grow in other areas (endometriosis). Problems with pelvic support. Cancer of the cervix, ovaries, uterus, or tissue that lines the uterus (endometrium). Excessive bleeding in the uterus. After this procedure, you will no longer be able to have a baby, and you will no longer have a menstrual period. Tell a health care provider about: Any allergies you have. All medicines you are taking, including vitamins, herbs, eye drops, creams, and over-the-counter medicines. Any problems you or family members have had with anesthetic medicines. Any blood disorders you have. Any surgeries you have had. Any medical conditions you have. Whether you are pregnant or may be pregnant. What are the risks? Generally, this is a safe procedure. However, problems may occur, including: Infection. Bleeding. Blood clots in the legs or lungs. Allergic reactions to medicines. Damage to nearby structures or organs. Having to change from this surgery to one in which a large incision is made in the abdomen (abdominal hysterectomy). What happens before the procedure? Staying hydrated Follow instructions from your health care provider about hydration, which may include: Up to 2 hours before the procedure - you may continue to drink clear liquids, such as water, clear fruit juice, black coffee, and plain tea.  Eating and drinking restrictions Follow instructions from your health care provider about eating and drinking, which may include: 8 hours before the procedure - stop eating heavy meals or foods, such as meat, fried  foods, or fatty foods. 6 hours before the procedure - stop eating light meals or foods, such as toast or cereal. 6 hours before the procedure - stop drinking milk or drinks that contain milk. 2 hours before the procedure - stop drinking clear liquids. Medicines Take over-the-counter and prescription medicines only as told by your health care provider. You may be asked to take medicine that helps you have a bowel movement (laxative) to prevent constipation. General instructions If you were asked to do bowel preparation before the procedure, follow instructions from your health care provider. This procedure can affect the way you feel about yourself. Talk with your health care provider about the physical and emotional changes hysterectomy may cause. Do not use any products that contain nicotine or tobacco for at least 4 weeks before the procedure. These products include cigarettes, chewing tobacco, and vaping devices, such as e-cigarettes. If you need help quitting, ask your health care provider. Plan to have a responsible adult take you home from the hospital or clinic. Plan to have a responsible adult care for you for the time you are told after you leave the hospital or clinic. This is important. Surgery safety Ask your health care provider: How your surgery site will be marked. What steps will be taken to help prevent infection. These may include: Removing hair at the surgery site. Washing skin with a germ-killing soap. Receiving antibiotic medicine. What happens during the procedure? An IV will be inserted into one of your veins. You will be given one or more of the following: A medicine to help you relax (sedative). A medicine to make you fall asleep (general anesthetic). A medicine to numb the area (local anesthetic). A medicine  that is injected into your spine to numb the area below and slightly above the injection site (spinal anesthetic). A medicine that is injected into an area of  your body to numb everything below the injection site (regional anesthetic). A gas will be used to inflate your abdomen. This will allow your surgeon to look inside your abdomen and do the surgery. Three or four small incisions will be made in your abdomen. A small device with a light (laparoscope) will be inserted into one of your incisions. Surgical instruments will be inserted through the other incisions in order to perform the procedure. Your uterus and cervix may be removed through your vagina or cut into small pieces and removed through the small incisions. Any other organs that need to be removed will also be removed this way. The gas will be released from inside your abdomen. Your incisions will be closed with stitches (sutures), skin glue, or adhesive strips. A bandage (dressing) may be placed over your incisions. The procedure may vary among health care providers and hospitals. What happens after the procedure? Your blood pressure, heart rate, breathing rate, and blood oxygen level will be monitored until you leave the hospital or clinic. You will be given medicine for pain as needed. You will be encouraged to walk as soon as possible. You will also use a device to help you breathe or do breathing exercises to keep your lungs clear. You may have to wear compression stockings. These stockings help to prevent blood clots and reduce swelling in your legs. You will need to wear a sanitary pad for vaginal discharge or bleeding. Summary Total laparoscopic hysterectomy is a procedure to remove your uterus, cervix, and sometimes the fallopian tubes and ovaries. This procedure can affect the way you feel about yourself. Talk with your health care provider about the physical and emotional changes hysterectomy may cause. After this procedure, you will no longer be able to have a baby, and you will no longer have a menstrual period. You will be given pain medicine to control discomfort after this  procedure. Plan to have a responsible adult take you home from the hospital or clinic. This information is not intended to replace advice given to you by your health care provider. Make sure you discuss any questions you have with your health care provider. Document Revised: 02/28/2022 Document Reviewed: 08/19/2020 [employer] Patient Education  2023 [employer]  

## 2022-09-10 NOTE — Progress Notes (Unsigned)
GYNECOLOGY  VISIT   HPI: 46 y.o.   Single  African American  female   G0P0 with Patient's last menstrual period was 08/31/2022.   here for heavy/painful cycles. Had PUS and CT scan which is in Epic. Hx of Fibroids.   Patient was seen in urgent care for RLQ pain.  She was referred to the ER for visit on 08/22/22 and then again on 08/25/22.  On 08/22/22 she was dx with anemia and she received a blood transfusion with 2 units of packed red blood cells.  Hgb nadir measured at 5.2 on 08/23/22. Her Hgb increased to 8.0 by 08/25/22 at her subsequent visit to the ER.  She was given Rx for iron.   No prior treatment for fibroids.   Hx endometriosis.  CT scan 08/23/22 showed uterus 10.3 x 13.9 x 18.2 cm.    Pelvic US 08/23/22 Study Result  Narrative & Impression  CLINICAL DATA:  Right lower quadrant pelvic pain.   EXAM: TRANSABDOMINAL AND TRANSVAGINAL ULTRASOUND OF PELVIS   DOPPLER ULTRASOUND OF OVARIES   TECHNIQUE: Both transabdominal and transvaginal ultrasound examinations of the pelvis were performed. Transabdominal technique was performed for global imaging of the pelvis including uterus, ovaries, adnexal regions, and pelvic cul-de-sac.   It was necessary to proceed with endovaginal exam following the transabdominal exam to visualize the endometrium and ovaries. Color and duplex Doppler ultrasound was utilized to evaluate blood flow to the ovaries.   COMPARISON:  None Available.   FINDINGS: Uterus   Measurements: 16.3 x 8.4 x 12.4 cm = volume: 890 mL. The uterus is enlarged and myomatous. Multiple fibroids including a 4.1 x 3.8 x 3.9 cm right fundal intramural fibroid, a 6.4 x 5.7 x 6.0 cm left anterior intramural fibroid, and a 5.1 x 4.2 x 4.0 cm left anterior lower uterine intramural fibroid.   Endometrium   Thickness: 7 mm. The endometrium is poorly visualized and suboptimally evaluated.   Right ovary   Measurements: 7.0 x 3.2 x 3.7 cm = volume: 44 mL. Probable  small complex/hemorrhagic dominant follicle or corpus luteum. Doppler images demonstrate arterial and venous flow the right ovary.   Left ovary   Not visualized.   Other findings   No abnormal free fluid.   IMPRESSION: 1. Enlarged myomatous uterus. 2. Grossly unremarkable right ovary. 3. Nonvisualization of the left ovary and poorly visualized endometrium.     Electronically Signed   By: Anner Crete M.D.   On: 08/23/2022 02:01     Menses are regular and last 7 days.  Has low back ache, cramps.  She has heavy bleeding and changes pad change every 2 hours for the first 3 days.  No bleeding in between cycles.   Feels pressure and has urinary frequency.   Having vaginitis symptoms.  Accepts testing for STDs.   Declines future childbearing.  Considering hysterectomy.   Does not need pregnancy prevention.  Female preference for sexual activity.  Not sexually active for one year.   GYNECOLOGIC HISTORY: Patient's last menstrual period was 08/31/2022. Contraception:  Abstinence Menopausal hormone therapy:  n/a Last mammogram:  Never Last pap smear:   Years ago--always normal        OB History     Gravida  0   Para      Term      Preterm      AB      Living         SAB      IAB  Ectopic      Multiple      Live Births                 Patient Active Problem List   Diagnosis Date Noted   Symptomatic anemia 08/23/2022   Endometriosis    Fibroid uterus    Menorrhagia    Right lower quadrant pain     Past Medical History:  Diagnosis Date   Endometriosis    Fibroid uterus    PID (pelvic inflammatory disease)    Trichimoniasis     Past Surgical History:  Procedure Laterality Date   LAPAROSCOPIC ENDOMETRIOSIS FULGURATION      Current Outpatient Medications  Medication Sig Dispense Refill   ferrous sulfate 325 (65 FE) MG tablet Take 1 tablet (325 mg total) by mouth 3 (three) times daily with meals. 90 tablet 0   ferrous  sulfate 325 (65 FE) MG tablet Take 1 tablet (325 mg total) by mouth 3 (three) times daily with meals. 90 tablet 0   folic acid (FOLVITE) 1 MG tablet Take 1 tablet (1 mg total) by mouth daily. 30 tablet 0   meloxicam (MOBIC) 7.5 MG tablet Take 1 tablet (7.5 mg total) by mouth daily. (Patient not taking: Reported on 09/10/2022) 7 tablet 0   No current facility-administered medications for this visit.     ALLERGIES: Patient has no known allergies.  Family History  Problem Relation Age of Onset   Stroke Mother    Hypertension Mother    Hypertension Sister    Diabetes Maternal Grandmother     Social History   Socioeconomic History   Marital status: Single    Spouse name: Not on file   Number of children: Not on file   Years of education: Not on file   Highest education level: Not on file  Occupational History   Not on file  Tobacco Use   Smoking status: Never   Smokeless tobacco: Never  Vaping Use   Vaping Use: Never used  Substance and Sexual Activity   Alcohol use: No   Drug use: No   Sexual activity: Not Currently    Birth control/protection: None  Other Topics Concern   Not on file  Social History Narrative   Not on file   Social Determinants of Health   Financial Resource Strain: Not on file  Food Insecurity: Not on file  Transportation Needs: Not on file  Physical Activity: Not on file  Stress: Not on file  Social Connections: Not on file  Intimate Partner Violence: Not on file    Review of Systems  Genitourinary:  Positive for menstrual problem (heavy/painful menses).  All other systems reviewed and are negative.   PHYSICAL EXAMINATION:    BP 100/72   Pulse 89   Ht '5\' 4"'$  (1.626 m)   Wt 161 lb (73 kg)   LMP 08/31/2022   SpO2 99%   BMI 27.64 kg/m     General appearance: alert, cooperative and appears stated age Head: Normocephalic, without obvious abnormality, atraumatic Lungs: clear to auscultation bilaterally Heart: regular rate and  rhythm Abdomen: mass to umbilicus.  Abdomen is soft, non-tender, no masses,  no organomegaly Extremities: extremities normal, atraumatic, no cyanosis or edema No abnormal inguinal nodes palpated Neurologic: Grossly normal  Pelvic: External genitalia:  no lesions              Urethra:  normal appearing urethra with no masses, tenderness or lesions  Bartholins and Skenes: normal                 Vagina: normal appearing vagina with normal color and discharge, no lesions              Cervix: no lesions                Bimanual Exam:  Uterus:  18 - 20 week size uterus.  Nontender.               Adnexa: no mass, fullness, tenderness               Chaperone was present for exam:  yes.   ASSESSMENT  Menorrhagia with regular menses.  Fibroids with enlarged uterus.  Hx PID.  Hx endometriosis.  Vaginitis.  STD screening.  Cervical cancer screening.   PLAN  Pap and HR HPV collected.  Vaginitis testing.  STD screening.  Return for endometrial biopsy.  Rationale explained.  We discussed treatment options for an enlarged uterus due to fibroids.  She Declines uterine artery embolization and medical therapy.  She is interested in definitive tx with hysterectomy.  Would consider total laparoscopic hysterectomy approach.    An After Visit Summary was printed and given to the patient.

## 2022-09-11 LAB — CERVICOVAGINAL ANCILLARY ONLY
Bacterial Vaginitis (gardnerella): POSITIVE — AB
Candida Glabrata: NEGATIVE
Candida Vaginitis: NEGATIVE
Comment: NEGATIVE
Comment: NEGATIVE
Comment: NEGATIVE

## 2022-09-12 ENCOUNTER — Encounter: Payer: Self-pay | Admitting: Obstetrics and Gynecology

## 2022-09-12 MED ORDER — METRONIDAZOLE 0.75 % VA GEL: 1.0000 | Freq: Every day | VAGINAL | 0 refills | Status: DC

## 2022-09-12 NOTE — Telephone Encounter (Signed)
Per result note " Please contact patient regarding treatment for bacterial vaginosis. She may treat with Flagyl 500 mg po bid for 7 days or Metrogel pv at hs for 5 nights.    Her testing is negative for yeast.    The remaining testing is still in process. "

## 2022-09-19 NOTE — Progress Notes (Signed)
GYNECOLOGY  VISIT   HPI: 46 y.o.   Single  African American  female   G0P0 with Patient's last menstrual period was 08/31/2022.   here for endometrial biopsy for menorrhagia with regular cycles.  She has fibroids and anemia.  She also has dysmenorrhea.   She was just treated for BV, noted with vaginitis testing and with her pap smear.  Her symptoms resolved.   She will return for colposcopy dur to LGSIL possible HGSIL pap.   Works at [employer].   UPT negative.   GYNECOLOGIC HISTORY: Patient's last menstrual period was 08/31/2022. Contraception:  Abstinence Menopausal hormone therapy:  n/a Last mammogram:  never Last pap smear:  09-10-22 LSIL;poss.HSIL:Neg HR HPV.  Years ago--normal.        OB History     Gravida  0   Para      Term      Preterm      AB      Living         SAB      IAB      Ectopic      Multiple      Live Births                 Patient Active Problem List   Diagnosis Date Noted   Symptomatic anemia 08/23/2022   Endometriosis    Fibroid uterus    Menorrhagia    Right lower quadrant pain     Past Medical History:  Diagnosis Date   Endometriosis    Fibroid uterus    PID (pelvic inflammatory disease)    Trichimoniasis     Past Surgical History:  Procedure Laterality Date   LAPAROSCOPIC ENDOMETRIOSIS FULGURATION      Current Outpatient Medications  Medication Sig Dispense Refill   ferrous sulfate 325 (65 FE) MG tablet Take 1 tablet (325 mg total) by mouth 3 (three) times daily with meals. 90 tablet 0   No current facility-administered medications for this visit.     ALLERGIES: Patient has no known allergies.  Family History  Problem Relation Age of Onset   Stroke Mother    Hypertension Mother    Hypertension Sister    Diabetes Maternal Grandmother     Social History   Socioeconomic History   Marital status: Single    Spouse name: Not on file   Number of children: Not on file   Years of education: Not on  file   Highest education level: Not on file  Occupational History   Not on file  Tobacco Use   Smoking status: Never   Smokeless tobacco: Never  Vaping Use   Vaping Use: Never used  Substance and Sexual Activity   Alcohol use: No   Drug use: No   Sexual activity: Not Currently    Birth control/protection: None  Other Topics Concern   Not on file  Social History Narrative   Not on file   Social Determinants of Health   Financial Resource Strain: Not on file  Food Insecurity: Not on file  Transportation Needs: Not on file  Physical Activity: Not on file  Stress: Not on file  Social Connections: Not on file  Intimate Partner Violence: Not on file    Review of Systems  All other systems reviewed and are negative.   PHYSICAL EXAMINATION:    BP 118/72   Pulse 70   Ht '5\' 4"'$  (1.626 m)   Wt 161 lb (73 kg)   LMP 08/31/2022  SpO2 100%   BMI 27.64 kg/m     General appearance: alert, cooperative and appears stated age   Abdomen: soft, nontender, uterus to umbilicus plus 2 cm.  Extends more to the right than the left.    Pelvic: External genitalia:  no lesions              Urethra:  normal appearing urethra with no masses, tenderness or lesions              Bartholins and Skenes: normal                 Vagina: normal appearing vagina with normal color and discharge, no lesions              Cervix: no lesions                Bimanual Exam:  Uterus:  21 week size uterus.               Adnexa: no mass, fullness, tenderness       Endometrial biopsy Consent done.  Hibiclens prep.  Paracervical block 10 cc 1% lidocaine, lot [id number], exp 01/01/24.  Tenaculum to anterior cervical lip.  Os finder used.  Pipelle passed to almost 11 cm x 2.  Tissue to pathology.  No complications.  Minimal EBL.  Chaperone was present for exam:  Estill Bamberg, CMA  ASSESSMENT  Menorrhagia with regular menses.  Fibroids.   Hx anemia.  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   PLAN  FU  EMB results.  Pap reviewed.  Return for colposcopy.  Procedure explained.  Will work toward hysterectomy.   An After Visit Summary was printed and given to the patient.

## 2022-09-20 LAB — CYTOLOGY - PAP
Chlamydia: NEGATIVE
Comment: NEGATIVE
Comment: NEGATIVE
Comment: NEGATIVE
Comment: NORMAL
High risk HPV: NEGATIVE
Neisseria Gonorrhea: NEGATIVE
Trichomonas: NEGATIVE

## 2022-09-21 ENCOUNTER — Other Ambulatory Visit: Payer: Self-pay | Admitting: *Deleted

## 2022-09-21 ENCOUNTER — Encounter: Payer: Self-pay | Admitting: Obstetrics and Gynecology

## 2022-09-21 DIAGNOSIS — R87612 Low grade squamous intraepithelial lesion on cytologic smear of cervix (LGSIL): Secondary | ICD-10-CM

## 2022-09-24 ENCOUNTER — Encounter: Payer: Self-pay | Admitting: Obstetrics and Gynecology

## 2022-09-24 ENCOUNTER — Other Ambulatory Visit (HOSPITAL_COMMUNITY)
Admission: RE | Admit: 2022-09-24 | Discharge: 2022-09-24 | Disposition: A | Discharge status: Home / Self Care | Payer: Commercial Managed Care - HMO | Source: Ambulatory Visit | Attending: Obstetrics and Gynecology | Admitting: Obstetrics and Gynecology

## 2022-09-24 ENCOUNTER — Ambulatory Visit (INDEPENDENT_AMBULATORY_CARE_PROVIDER_SITE_OTHER): Payer: Commercial Managed Care - HMO | Admitting: Obstetrics and Gynecology

## 2022-09-24 VITALS — BP 118/72 | HR 70 | Ht 64.0 in | Wt 161.0 lb

## 2022-09-24 DIAGNOSIS — R87612 Low grade squamous intraepithelial lesion on cytologic smear of cervix (LGSIL): Secondary | ICD-10-CM | POA: Diagnosis not present

## 2022-09-24 DIAGNOSIS — N920 Excessive and frequent menstruation with regular cycle: Secondary | ICD-10-CM

## 2022-09-24 DIAGNOSIS — Z01812 Encounter for preprocedural laboratory examination: Secondary | ICD-10-CM

## 2022-09-24 LAB — PREGNANCY, URINE: Preg Test, Ur: NEGATIVE

## 2022-09-25 LAB — SURGICAL PATHOLOGY

## 2022-09-26 ENCOUNTER — Encounter: Payer: Self-pay | Admitting: Obstetrics and Gynecology

## 2022-09-26 NOTE — Telephone Encounter (Signed)
Please let patient know that her endometrial biopsy is benign.  There is no sign of polyp, precancer or cancer.   I suspect that the patient is starting her menstrual cycle.  She has known heavy cycles due to fibroids, which was the reason for her initial consultation.   I agree with your recommendations.   Have her take her iron three times daily.   We will recheck her blood counts at her colposcopy appointment.

## 2022-09-26 NOTE — Telephone Encounter (Signed)
Patient post endometrial biopsy 09/25/22.  Spoke with patient she reports the heavier flow and cramping just started now. Patient said it feels like her period cramps would feel, reports her cycle started on 08/31/22. She just put on a pad, has not changed yet.  Patient states with normal cycle is heavy for first 3 days then bleeding slows down, normal cycle is 7 days. Bleeding precautions about changing every 1 hour or less than an hour to let us know ASAP.  Anything else you want me to relay to patient?

## 2022-10-05 NOTE — Progress Notes (Signed)
  Subjective:     Patient ID: Dawn Lyons, female   DOB: 01/22/1976, 46 y.o.   MRN: [id number]  Here for colposcopy today.  Pap: 09-10-22 LGSIL, few cells suggestive of HGSIL.  Negative HR HPV. UPT:neg  Having some vaginal discharge and odor.  No itching.  She used Metrogel to treat BV one month ago.  Symptoms returned.  Not bleeding today.  LMP:  09/28/22 UPT:  negative.  Abstaining from sex. Last activity 1 year ago.  Patient has fibroids and is working toward hysterectomy.      Objective:   Physical Exam  Pelvic exam:   External genitalia:  right labia majora with 7 cm sebaceous cyst.   Normal urethra.  Vagina:  no lesions.  Yellow discharge noted.  Cervix:  no lesions.      Assessment:     Vaginal odor.  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  Negative HR HPV.    Plan:     Wet prep:  positive clue cells, negative yeast, negative trichomonas.  Flagyl 500 mg po bid x 7 days.  We discussed abnormal paps, colposcopy and LEEP procedure.   She will complete her serum STD screening today.   She will schedule her mammogram.  Locations and contact information to patient.   29 min  total time was spent for this patient encounter, including preparation, face-to-face counseling with the patient, coordination of care, and documentation of the encounter.

## 2022-10-08 ENCOUNTER — Encounter: Payer: Self-pay | Admitting: Obstetrics and Gynecology

## 2022-10-08 ENCOUNTER — Ambulatory Visit (INDEPENDENT_AMBULATORY_CARE_PROVIDER_SITE_OTHER): Payer: Commercial Managed Care - HMO | Admitting: Obstetrics and Gynecology

## 2022-10-08 VITALS — BP 118/80 | HR 88 | Wt 166.6 lb

## 2022-10-08 DIAGNOSIS — R87612 Low grade squamous intraepithelial lesion on cytologic smear of cervix (LGSIL): Secondary | ICD-10-CM

## 2022-10-08 DIAGNOSIS — N760 Acute vaginitis: Secondary | ICD-10-CM | POA: Diagnosis not present

## 2022-10-08 DIAGNOSIS — B9689 Other specified bacterial agents as the cause of diseases classified elsewhere: Secondary | ICD-10-CM

## 2022-10-08 DIAGNOSIS — Z01812 Encounter for preprocedural laboratory examination: Secondary | ICD-10-CM | POA: Diagnosis not present

## 2022-10-08 DIAGNOSIS — N898 Other specified noninflammatory disorders of vagina: Secondary | ICD-10-CM

## 2022-10-08 LAB — PREGNANCY, URINE: Preg Test, Ur: NEGATIVE

## 2022-10-08 LAB — WET PREP FOR TRICH, YEAST, CLUE

## 2022-10-08 MED ORDER — METRONIDAZOLE 500 MG PO TABS: 500.0000 mg | ORAL_TABLET | Freq: Two times a day (BID) | ORAL | 0 refills | Status: DC

## 2022-10-08 NOTE — Patient Instructions (Addendum)
Colposcopy  Colposcopy is a procedure to examine the lowest part of the uterus (cervix) for abnormalities or signs of disease. This procedure is done using an instrument that makes objects appear larger and provides light (colposcope). During the procedure, your health care provider may remove a tissue sample to look at under a microscope (biopsy). A biopsy may be done if any unusual cells are seen during the colposcopy. You may have a colposcopy if you have: An abnormal Pap smear, also called a Pap test. This screening test is used to check for signs of cancer or infection of the vagina, cervix, and uterus. An HPV (human papillomavirus) test and get a positive result for a type of HPV that puts you at high risk of cancer. Certain conditions or symptoms, such as: A sore, or lesion, on your cervix. Genital warts on your vulva, vagina, or cervix. Pain during sex. Vaginal bleeding, especially after sex. A growth on your cervix (cervical polyp) that needs to be removed. Let your health care provider know about: Any allergies you have, including allergies to medicines, latex, or iodine. All medicines you are taking, including vitamins, herbs, eye drops, creams, and over-the-counter medicines. Any bleeding problems you have. Any surgeries you have had. Any medical conditions you have, such as pelvic inflammatory disease (PID) or an endometrial disorder. The pattern of your menstrual cycles and the form of birth control (contraception) you use, if any. Your medical history, including any cervical treatments and how well you tolerated the procedure (if you have ever fainted). Whether you are pregnant or may be pregnant. What are the risks? Generally, this is a safe procedure. However, problems may occur, including: Infection. Symptoms of infection may include fever, bad-smelling vaginal discharge, or pelvic pain. Vaginal bleeding. Allergic reactions to medicines. Damage to nearby structures or  organs. What happens before the procedure? Medicines Ask your health care provider about: Changing or stopping your regular medicines. This is especially important if you are taking diabetes medicines or blood thinners. Taking medicines such as aspirin and ibuprofen. These medicines can thin your blood. Do not take these medicines unless your health care provider tells you to take them. Your health care provider will likely tell you to avoid taking aspirin, or medicine that contains aspirin, for 7 days before the procedure. Taking over-the-counter medicines, vitamins, herbs, and supplements. General instructions Tell your health care provider if you have your menstrual period now or will have it at the time of your procedure. A colposcopy is not normally done during your menstrual period. If you use contraception, continue to use it before your procedure. For 24 hours before the procedure: Do not use douche products or tampons. Do not use medicines, creams, or suppositories in the vagina. Do not have sex or insert anything into your vagina. Ask your health care provider what steps will be taken to prevent infection. What happens during the procedure? You will lie down on your back, with your feet in foot rests (stirrups). An instrument called a speculum will be inserted into your vagina. This will be used so your health care provider can see your cervix and the inside of your vagina. A cotton swab will be used to place a small amount of a liquid (solution) on the areas to be examined. This solution makes it easier to see abnormal cells. You may feel a slight burning during this part. The colposcope will be used to scan the cervix with a bright white light. The colposcope will be held near  your vulva and will make your vulva, vagina, and cervix look bigger so they can be seen better. If a biopsy is needed: You may be given a medicine to numb the area (local anesthetic). Surgical tools will be  used to remove mucus and cells through your vagina. You may feel mild pain while the tissue sample is removed. Bleeding may occur. A solution may be used to stop the bleeding. The tissue removed will be sent to a lab to be looked at under a microscope. The procedure may vary among health care providers and hospitals. What happens after the procedure? You may have some cramping in your abdomen. This should go away after a few minutes. It is up to you to get the results of your procedure. Ask your health care provider, or the department that is doing the procedure, when your results will be ready. Summary Colposcopy is a procedure to examine the lowest part of the uterus (cervix), for signs of disease. A biopsy may be done as part of the procedure. You may have some cramping in your abdomen. This should go away after a few minutes. It is up to you to get the results of your procedure. Ask your health care provider, or the department that is doing the procedure, when your results will be ready. This information is not intended to replace advice given to you by your health care provider. Make sure you discuss any questions you have with your health care provider. Document Revised: 05/14/2021 Document Reviewed: 05/14/2021 Elsevier Patient Education  [place].   Cervical Dysplasia  Cervical dysplasia is a condition in which the cells in a woman's cervix have abnormal changes. The cervix is the opening of the uterus. It is located between the vagina and the uterus. Cervical dysplasia may be an early sign of cervical cancer. If left untreated, this condition may become more severe and may progress to cervical cancer. Early detection, treatment, and follow-up care are very important. What are the causes? Cervical dysplasia is usually caused by a human papillomavirus (HPV) infection. HPV is spread from person to person through sexual contact. This includes oral, vaginal, or anal sex. HPV is  the most common sexually transmitted infection (STI). You are more likely to be exposed to HPV through sexual contact if: You have had more than one sexual partner or you have a sexual partner who has multiple sexual partners. You do not use a condom during sex, especially with new sexual partners. What increases the risk? The following factors may make you more likely to develop this condition: Having a family history of cervical cancer or a personal history of cancer of the vagina or vulva. Having had an STI, such as herpes, chlamydia, or gonorrhea. Becoming sexually active before age 26. Having a weakened disease-fighting system (immunesystem). Smoking. Being the daughter of a woman who took diethylstilbestrol (DES), a synthetic estrogen, during pregnancy. What are the signs or symptoms? There are usually no symptoms of this condition. If you do have symptoms, they may include: Abnormal vaginal discharge. Bleeding between periods or after sex. Bleeding during menopause. Pain during sex. How is this diagnosed? This condition may be diagnosed with a Pap test. During this test, cells are swabbed from the cervix and checked under a microscope. If the Pap test is abnormal or if the cervix looks abnormal, you may also have a test in which a tissue sample is removed from the cervix and looked at under a microscope(biopsy). How is this treated?  Treatment varies based on the severity of the condition. Treatment may include: Cryotherapy. During this therapy, the abnormal cells are frozen with a steel-tipped instrument. Loop electrosurgical excision procedure (LEEP). LEEP removes abnormal tissue from the cervix. Surgery to remove abnormal tissue. This is usually done in more severe cases. Options include: A cone biopsy. This treatment removes the cervical canal and part of the center of the cervix. Hysterectomy. This is a surgery in which the uterus and cervix are removed. Follow these  instructions at home: Take over-the-counter and prescription medicines only as told by your health care provider. Do not use tampons, have sex, or douche until your health care provider says it is safe. Keep all follow-up visits. This is important. Women who have been treated for cervical dysplasia should have regular pelvic exams and Pap tests. How is this prevented? Practice safe sex to help prevent STIs. Have regular Pap tests. Talk with your health care provider about how often you need these tests. Pap tests will help identify cell changes that can lead to cancer. Ask your health care provider about possible vaccines to protect yourself against HPV. Contact a health care provider if: You develop genital warts. The risk of cervical cancer is higher with certain types of HPV. Your menstrual period is heavier than normal or you develop bright red bleeding, which may include blood clots. You have abnormal vaginal discharge. You have a fever. Get help right away if: You have pain or cramps in the abdomen that get worse, and medicine does not help to relieve your pain. You feel light-headed and are unusually weak, or you faint. Summary Cervical dysplasia is a condition in which a woman's cervix cells have abnormal changes. If left untreated, this condition may become more severe and may progress to cervical cancer. Early detection, treatment, and follow-up care are very important in managing this condition. Have regular pelvic exams and Pap tests. Talk with your health care provider about how often you need these tests. Pap tests will help identify cell changes that can lead to cancer. This information is not intended to replace advice given to you by your health care provider. Make sure you discuss any questions you have with your health care provider. Document Revised: 06/24/2020 Document Reviewed: 06/24/2020 Elsevier Patient Education  [place].   Bacterial  Vaginosis  Bacterial vaginosis is an infection that occurs when the normal balance of bacteria in the vagina changes. This change is caused by an overgrowth of certain bacteria in the vagina. Bacterial vaginosis is the most common vaginal infection among females aged 21 to 18 years. This condition increases the risk of sexually transmitted infections (STIs). Treatment can help reduce this risk. Treatment is very important for pregnant women because this condition can cause babies to be born early (prematurely) or at a low birth weight. What are the causes? This condition is caused by an increase in harmful bacteria that are normally present in small amounts in the vagina. However, the exact reason this condition develops is not known. You cannot get bacterial vaginosis from toilet seats, bedding, swimming pools, or contact with objects around you. What increases the risk? The following factors may make you more likely to develop this condition: Having a new sexual partner or multiple sexual partners, or having unprotected sex. Douching. Having an intrauterine device (IUD). Smoking. Abusing drugs and alcohol. This may lead to riskier sexual behavior. Taking certain antibiotic medicines. Being pregnant. What are the signs or symptoms? Some women with this  condition have no symptoms. Symptoms may include: Pear[place] Cables or white vaginal discharge. The discharge can be watery or foamy. A fish-like odor with discharge, especially after sex or during menstruation. Itching in and around the vagina. Burning or pain with urination. How is this diagnosed? This condition is diagnosed based on: Your medical history. A physical exam of the vagina. Checking a sample of vaginal fluid for harmful bacteria or abnormal cells. How is this treated? This condition is treated with antibiotic medicines. These may be given as a pill, a vaginal cream, or a medicine that is put into the vagina (suppository). If the  condition comes back after treatment, a second round of antibiotics may be needed. Follow these instructions at home: Medicines Take or apply over-the-counter and prescription medicines only as told by your health care provider. Take or apply your antibiotic medicine as told by your health care provider. Do not stop using the antibiotic even if you start to feel better. General instructions If you have a female sexual partner, tell her that you have a vaginal infection. She should follow up with her health care provider. If you have a female sexual partner, he does not need treatment. Avoid sexual activity until you finish treatment. Drink enough fluid to keep your urine pale yellow. Keep the area around your vagina and rectum clean. Wash the area daily with warm water. Wipe yourself from front to back after using the toilet. If you are breastfeeding, talk to your health care provider about continuing breastfeeding during treatment. Keep all follow-up visits. This is important. How is this prevented? Self-care Do not douche. Wash the outside of your vagina with warm water only. Wear cotton or cotton-lined underwear. Avoid wearing tight pants and pantyhose, especially during the summer. Safe sex Use protection when having sex. This includes: Using condoms. Using dental dams. This is a thin layer of a material made of latex or polyurethane that protects the mouth during oral sex. Limit the number of sexual partners. To help prevent bacterial vaginosis, it is best to have sex with just one partner (monogamous relationship). Make sure you and your sexual partner are tested for STIs. Drugs and alcohol Do not use any products that contain nicotine or tobacco. These products include cigarettes, chewing tobacco, and vaping devices, such as e-cigarettes. If you need help quitting, ask your health care provider. Do not use drugs. Do not drink alcohol if: Your health care provider tells you not to  do this. You are pregnant, may be pregnant, or are planning to become pregnant. If you drink alcohol: Limit how much you have to 0-1 drink a day. Be aware of how much alcohol is in your drink. In the [place] one drink equals one 12 oz bottle of beer (355 mL), one 5 oz glass of wine (148 mL), or one 1 oz glass of hard liquor (44 mL). Where to find more information Centers for Disease Control and Prevention: http://www.wolf.info/ American Sexual Health Association (ASHA): www.ashastd.org [place].S. Department of Health and Financial controller, Office on Women's Health: [place] Contact a health care provider if: Your symptoms do not improve, even after treatment. You have more discharge or pain when urinating. You have a fever or chills. You have pain in your abdomen or pelvis. You have pain during sex. You have vaginal bleeding between menstrual periods. Summary Bacterial vaginosis is a vaginal infection that occurs when the normal balance of bacteria in the vagina changes. It results from an overgrowth of certain bacteria. This condition increases  the risk of sexually transmitted infections (STIs). Getting treated can help reduce this risk. Treatment is very important for pregnant women because this condition can cause babies to be born early (prematurely) or at low birth weight. This condition is treated with antibiotic medicines. These may be given as a pill, a vaginal cream, or a medicine that is put into the vagina (suppository). This information is not intended to replace advice given to you by your health care provider. Make sure you discuss any questions you have with your health care provider. Document Revised: 06/16/2020 Document Reviewed: 06/16/2020 Elsevier Patient Education  [place].

## 2022-10-09 LAB — RPR: RPR Ser Ql: NONREACTIVE

## 2022-10-09 LAB — HIV ANTIBODY (ROUTINE TESTING W REFLEX): HIV 1&2 Ab, 4th Generation: NONREACTIVE

## 2022-10-09 LAB — HEPATITIS C ANTIBODY: Hepatitis C Ab: NONREACTIVE

## 2022-10-12 NOTE — Telephone Encounter (Signed)
MyChart message reply to patient.   Routing to Dr. Quincy Simmonds.   Encounter closed.

## 2022-11-14 ENCOUNTER — Ambulatory Visit (INDEPENDENT_AMBULATORY_CARE_PROVIDER_SITE_OTHER): Payer: Commercial Managed Care - HMO | Admitting: Obstetrics and Gynecology

## 2022-11-14 ENCOUNTER — Encounter: Payer: Self-pay | Admitting: Obstetrics and Gynecology

## 2022-11-14 ENCOUNTER — Telehealth: Payer: Self-pay | Admitting: Obstetrics and Gynecology

## 2022-11-14 ENCOUNTER — Other Ambulatory Visit (HOSPITAL_COMMUNITY)
Admission: RE | Admit: 2022-11-14 | Discharge: 2022-11-14 | Disposition: A | Discharge status: Home / Self Care | Payer: Commercial Managed Care - HMO | Source: Ambulatory Visit | Attending: Obstetrics and Gynecology | Admitting: Obstetrics and Gynecology

## 2022-11-14 VITALS — BP 110/80 | HR 80 | Ht 64.0 in | Wt 171.0 lb

## 2022-11-14 DIAGNOSIS — K629 Disease of anus and rectum, unspecified: Secondary | ICD-10-CM | POA: Diagnosis not present

## 2022-11-14 DIAGNOSIS — R87612 Low grade squamous intraepithelial lesion on cytologic smear of cervix (LGSIL): Secondary | ICD-10-CM

## 2022-11-14 DIAGNOSIS — I471 Supraventricular tachycardia, unspecified: Secondary | ICD-10-CM

## 2022-11-14 DIAGNOSIS — R85612 Low grade squamous intraepithelial lesion on cytologic smear of anus (LGSIL): Secondary | ICD-10-CM | POA: Diagnosis not present

## 2022-11-14 NOTE — Progress Notes (Signed)
GYNECOLOGY  VISIT   HPI: 46 y.o.   Single  African American  female   G0P0 with Patient's last menstrual period was 11/06/2022.   here for colpo and biopsy. Last pap smear:   09-10-22 LSIL;poss.HSIL:Neg HR HPV.   She was treated for BV with Flagyl on 10/08/22.   Recent trip to the [place] for tachycardia.  Dx with SVT and treated with adenosine, which converted her back to sinus rhythm.  She had an elevated troponin.  Her CT angiogram showed no evidence of PE.  Cardiac ECHO showed EF 65-70%. She received an iron transfusion.   She is planning eventual hysterectomy for symptomatic fibroids.   GYNECOLOGIC HISTORY: Patient's last menstrual period was 11/06/2022. Contraception:  abstinence Menopausal hormone therapy:  n/a Last mammogram:  n/a Last pap smear:   09-10-22 LSIL;poss.HSIL:Neg HR HPV.  Years ago--normal.         OB History     Gravida  0   Para  0   Term  0   Preterm  0   AB  0   Living  0      SAB  0   IAB  0   Ectopic  0   Multiple  0   Live Births  0              Patient Active Problem List   Diagnosis Date Noted   Symptomatic anemia 08/23/2022   Endometriosis    Fibroid uterus    Menorrhagia    Right lower quadrant pain     Past Medical History:  Diagnosis Date   Endometriosis    Fibroid uterus    PID (pelvic inflammatory disease)    Trichimoniasis     Past Surgical History:  Procedure Laterality Date   LAPAROSCOPIC ENDOMETRIOSIS FULGURATION      Current Outpatient Medications  Medication Sig Dispense Refill   ferrous sulfate 325 (65 FE) MG tablet Take 1 tablet (325 mg total) by mouth 3 (three) times daily with meals. 90 tablet 0   No current facility-administered medications for this visit.     ALLERGIES: Patient has no known allergies.  Family History  Problem Relation Age of Onset   Stroke Mother    Hypertension Mother    Hypertension Sister    Diabetes Maternal Grandmother     Social History    Socioeconomic History   Marital status: Single    Spouse name: Not on file   Number of children: Not on file   Years of education: Not on file   Highest education level: Not on file  Occupational History   Not on file  Tobacco Use   Smoking status: Never   Smokeless tobacco: Never  Vaping Use   Vaping Use: Never used  Substance and Sexual Activity   Alcohol use: No   Drug use: No   Sexual activity: Not Currently    Birth control/protection: None  Other Topics Concern   Not on file  Social History Narrative   Not on file   Social Determinants of Health   Financial Resource Strain: Not on file  Food Insecurity: Not on file  Transportation Needs: Not on file  Physical Activity: Not on file  Stress: Not on file  Social Connections: Not on file  Intimate Partner Violence: Not on file    Review of Systems  All other systems reviewed and are negative.   PHYSICAL EXAMINATION:    BP 110/80 (BP Location: Right Arm, Patient Position:  Sitting, Cuff Size: Normal)   Pulse 80   Ht '5\' 4"'$  (1.626 m)   Wt 171 lb (77.6 kg)   LMP 11/06/2022   BMI 29.35 kg/m     General appearance: alert, cooperative and appears stated age   Colposcopy - cervix, vagina, and vulva.  Consent for procedure.  3% acetic acid used in vagina and on vulva. White light and green light filter used.  Colposcopy satisfactory:  Yes   __x___          No    _____ Findings:    Cervix:  no lesions. Vagina:  no lesions. Vulva/perianal region:  no vulvar HPV lesions.  2 sebaceous cysts of the right labia majora.  Left perianal region with verrucous 3 mm grey lesion.  Biopsies:   ECC and left perianal lesion.  4 mm punch biopsy of left perianal lesion to remove it after Hibiclens prep and local 1% lidocaine, lot [id number], exp 01/01/24. Single suture of 3/0 Vicryl.  Minimal EBL. No complications.   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 Perianal lesion.  Fibroids.   SVT.   PLAN  FU biopsies.  Post colposcopy precautions given.  Encounter reviewed for recent hospitalization for SVT care.  Referral to cardiology for any further evaluation and preop clearance.    An After Visit Summary was printed and given to the patient.  20  total time was spent for this patient encounter, including preparation, face-to-face counseling with the patient, coordination of care, and documentation of the encounter for SVT in addition to performing the colposcopy and biopsy.

## 2022-11-14 NOTE — Telephone Encounter (Signed)
Please make referral to [facility] for SVT, first available.  She was seen at [facility] on 11/07/22 for SVT.   Patient needs preop clearance for future hysterectomy due to symptomatic fibroids and anemia.  Hysterectomy has not been scheduled to date.

## 2022-11-14 NOTE — Patient Instructions (Signed)
Colposcopy, Care After  The following information offers guidance on how to care for yourself after your procedure. Your doctor may also give you more specific instructions. If you have problems or questions, contact your doctor. What can I expect after the procedure? If you did not have a sample of your tissue taken out (did not have a biopsy), you may only have some spotting of blood for a few days. You can go back to your normal activities. If you had a sample of your tissue taken out, it is common to have: Soreness and mild pain. These may last for a few days. Mild bleeding or fluid (discharge) coming from your vagina. The fluid will look dark and grainy. You may have this for a few days. The fluid may be caused by a liquid that was used during your procedure. You may need to wear a sanitary pad. Spotting of blood for at least 48 hours after the procedure. Follow these instructions at home: Medicines Take over-the-counter and prescription medicines only as told by your doctor. Ask your doctor what over-the-counter pain medicines and prescription medicines you can start taking again. This is very important if you take blood thinners. Activity For at least 3 days, or for as long as told by your doctor, avoid: Douching. Using tampons. Having sex. Return to your normal activities as told by your doctor. Ask your doctor what activities are safe for you. General instructions Ask your doctor if you may take baths, swim, or use a hot tub. You may take showers. If you use birth control (contraception), keep using it. Keep all follow-up visits. Contact a doctor if: You have a fever or chills. You faint or feel light-headed. Get help right away if: You bleed a lot from your vagina. A lot of bleeding means that the bleeding soaks through a pad in less than 1 hour. You have clumps of blood (blood clots) coming from your vagina. You have signs that could mean you have an infection. This may be  fluid coming from your vagina that is: Different than normal. Yellow. Bad-smelling. You have very bad pain or cramps in your lower belly that do not get better with medicine. Summary If you did not have a sample of your tissue taken out, you may only have some spotting of blood for a few days. You can go back to your normal activities. If you had a sample of your tissue taken out, it is common to have mild pain for a few days and spotting for 48 hours. Avoid douching, using tampons, and having sex for at least 3 days after the procedure or for as long as told. Get help right away if you have a lot of bleeding, very bad pain, or signs of infection. This information is not intended to replace advice given to you by your health care provider. Make sure you discuss any questions you have with your health care provider. Document Revised: 05/14/2021 Document Reviewed: 05/14/2021 Elsevier Patient Education  2023 Elsevier Inc.  

## 2022-11-15 NOTE — Telephone Encounter (Signed)
Order placed for ambulatory referral to cardiology.   Encounter closed.

## 2022-11-16 LAB — SURGICAL PATHOLOGY

## 2022-12-03 ENCOUNTER — Ambulatory Visit: Payer: Commercial Managed Care - HMO | Admitting: Obstetrics and Gynecology

## 2023-01-03 ENCOUNTER — Telehealth: Payer: Self-pay | Admitting: Obstetrics and Gynecology

## 2023-01-03 NOTE — Telephone Encounter (Signed)
Please contact patient in follow up to plans for potential hysterectomy.   She was referred to cardiology for SVT as part of preop clearance.  The referral was apparently closed due to inability to reach the patient.   If she would still like to proceed with surgical care, she will need preop clearance.  As I will not be doing surgery starting 12/31/22, I would recommend a referral to Dr. Currie Paris in Minimally Invasive Surgery.

## 2023-01-16 NOTE — Telephone Encounter (Signed)
I have closed this encounter.  We can wait for the patient to call us back.

## 2023-01-16 NOTE — Telephone Encounter (Signed)
Pt read mychart msg on 01/09/2023 and has not responded. Please advise on how you would like to proceed. Thanks.

## 2024-03-18 ENCOUNTER — Other Ambulatory Visit (HOSPITAL_COMMUNITY)
Admission: RE | Admit: 2024-03-18 | Discharge: 2024-03-18 | Disposition: A | Discharge status: Home / Self Care | Source: Ambulatory Visit | Attending: Obstetrics and Gynecology | Admitting: Obstetrics and Gynecology

## 2024-03-18 ENCOUNTER — Encounter: Payer: Self-pay | Admitting: Obstetrics and Gynecology

## 2024-03-18 ENCOUNTER — Ambulatory Visit (INDEPENDENT_AMBULATORY_CARE_PROVIDER_SITE_OTHER): Payer: Commercial Managed Care - HMO | Admitting: Obstetrics and Gynecology

## 2024-03-18 VITALS — BP 112/78 | HR 85 | Temp 98.0°F | Ht 64.25 in | Wt 185.0 lb

## 2024-03-18 DIAGNOSIS — Z124 Encounter for screening for malignant neoplasm of cervix: Secondary | ICD-10-CM | POA: Insufficient documentation

## 2024-03-18 DIAGNOSIS — D259 Leiomyoma of uterus, unspecified: Secondary | ICD-10-CM

## 2024-03-18 DIAGNOSIS — N766 Ulceration of vulva: Secondary | ICD-10-CM

## 2024-03-18 DIAGNOSIS — B9689 Other specified bacterial agents as the cause of diseases classified elsewhere: Secondary | ICD-10-CM

## 2024-03-18 DIAGNOSIS — N920 Excessive and frequent menstruation with regular cycle: Secondary | ICD-10-CM | POA: Diagnosis not present

## 2024-03-18 DIAGNOSIS — D649 Anemia, unspecified: Secondary | ICD-10-CM | POA: Diagnosis not present

## 2024-03-18 DIAGNOSIS — R87612 Low grade squamous intraepithelial lesion on cytologic smear of cervix (LGSIL): Secondary | ICD-10-CM | POA: Insufficient documentation

## 2024-03-18 DIAGNOSIS — Z01419 Encounter for gynecological examination (general) (routine) without abnormal findings: Secondary | ICD-10-CM | POA: Insufficient documentation

## 2024-03-18 DIAGNOSIS — N898 Other specified noninflammatory disorders of vagina: Secondary | ICD-10-CM

## 2024-03-18 DIAGNOSIS — N760 Acute vaginitis: Secondary | ICD-10-CM

## 2024-03-18 DIAGNOSIS — Z1211 Encounter for screening for malignant neoplasm of colon: Secondary | ICD-10-CM

## 2024-03-18 LAB — WET PREP FOR TRICH, YEAST, CLUE

## 2024-03-18 MED ORDER — DROSPIRENONE-ETHINYL ESTRADIOL 3-0.02 MG PO TABS: 1.0000 | ORAL_TABLET | Freq: Every day | ORAL | 4 refills | Status: DC

## 2024-03-18 MED ORDER — METRONIDAZOLE 500 MG PO TABS: 500.0000 mg | ORAL_TABLET | Freq: Two times a day (BID) | ORAL | 0 refills | Status: AC

## 2024-03-18 NOTE — Assessment & Plan Note (Addendum)
 2023 [place] notes 16cm uterus, now ~22-24cm by exam today Will repeat TVUS 08/2022 EMB benign Ultimately desires surgical management, will discuss at next appt

## 2024-03-18 NOTE — Assessment & Plan Note (Signed)
 Cervical cancer screening performed according to ASCCP guidelines. Encouraged annual mammogram screening Colonoscopy due, referral ordered Labs and immunizations with her primary Encouraged safe sexual practices as indicated Encouraged healthy lifestyle practices with diet and exercise For patients under 46yo, I recommend 1000mg  calcium daily and 600IU of vitamin D daily.

## 2024-03-18 NOTE — Addendum Note (Signed)
 Addended by: Darrell Jewel V on: 03/18/2024 11:17 AM   Modules accepted: Orders

## 2024-03-18 NOTE — Assessment & Plan Note (Signed)
 Start low dose COC for AUB Continue iron

## 2024-03-18 NOTE — Progress Notes (Signed)
 48 y.o. G0P0000 female with AUB-F, anemia requiring blood transfusion (2023 x1), endometriosis (s/p lap) here for annual exam. Single. Works in a [employer].  She reports vaginal bumps that comes during periods, and discharge clumpy white at times with occasional odor.  Patient's last menstrual period was 03/08/2024 (approximate). Period Duration (Days): 7 Period Pattern: Regular Menstrual Flow: Heavy Menstrual Control: Maxi pad Dysmenorrhea: (!) Severe Dysmenorrhea Symptoms: Nausea, Diarrhea, Headache  Abnormal bleeding: HVB with regular cycles, 3/7d, using 6pads on her heaviest days. No IMB Pelvic discharge or pain: none Breast mass, nipple discharge or skin changes : none Birth control: abstinent Last PAP:     Component Value Date/Time   DIAGPAP - Low grade squamous intraepithelial lesion (LSIL) (A) 09/10/2022 1147   DIAGPAP - See comment (A) 09/10/2022 1147   HPVHIGH Negative 09/10/2022 1147   ADEQPAP  09/10/2022 1147    Satisfactory for evaluation; transformation zone component PRESENT.   Last mammogram: never Last colonoscopy: never Sexually active: no  Exercising: no Smoker: no  GYN HISTORY: No significant history  OB History  Gravida Para Term Preterm AB Living  0 0 0 0 0 0  SAB IAB Ectopic Multiple Live Births  0 0 0 0 0    Past Medical History:  Diagnosis Date   Endometriosis    Fibroid uterus    PID (pelvic inflammatory disease)    Trichimoniasis     Past Surgical History:  Procedure Laterality Date   LAPAROSCOPIC ENDOMETRIOSIS FULGURATION      Current Outpatient Medications on File Prior to Visit  Medication Sig Dispense Refill   ferrous sulfate 325 (65 FE) MG tablet Take 1 tablet (325 mg total) by mouth 3 (three) times daily with meals. 90 tablet 0   No current facility-administered medications on file prior to visit.    Social History   Socioeconomic History   Marital status: Single    Spouse name: Not on file   Number of children:  Not on file   Years of education: Not on file   Highest education level: Not on file  Occupational History   Not on file  Tobacco Use   Smoking status: Never   Smokeless tobacco: Never  Vaping Use   Vaping status: Never Used  Substance and Sexual Activity   Alcohol use: No   Drug use: No   Sexual activity: Not Currently    Birth control/protection: None  Other Topics Concern   Not on file  Social History Narrative   Not on file   Social Drivers of Health   Financial Resource Strain: Not on file  Food Insecurity: Not on file  Transportation Needs: Not on file  Physical Activity: Not on file  Stress: Not on file  Social Connections: Not on file  Intimate Partner Violence: Not on file    Family History  Problem Relation Age of Onset   Stroke Mother    Hypertension Mother    Hypertension Sister    Diabetes Maternal Grandmother     No Known Allergies    PE Today's Vitals   03/18/24 0814  BP: 112/78  Pulse: 85  Temp: 98 F (36.7 C)  TempSrc: Oral  SpO2: 99%  Weight: 185 lb (83.9 kg)  Height: 5' 4.25" (1.632 m)   Body mass index is 31.51 kg/m.  Physical Exam Vitals reviewed. Exam conducted with a chaperone present.  Constitutional:      General: She is not in acute distress.    Appearance: Normal appearance.  HENT:     Head: Normocephalic and atraumatic.     Nose: Nose normal.  Eyes:     Extraocular Movements: Extraocular movements intact.     Conjunctiva/sclera: Conjunctivae normal.  Neck:     Thyroid: No thyroid mass, thyromegaly or thyroid tenderness.  Pulmonary:     Effort: Pulmonary effort is normal.  Chest:     Chest wall: No mass or tenderness.  Breasts:    Right: Normal. No swelling, mass, nipple discharge, skin change or tenderness.     Left: Normal. No swelling, mass, nipple discharge, skin change or tenderness.  Abdominal:     General: There is distension.     Palpations: Abdomen is soft. There is mass.     Tenderness: There is no  abdominal tenderness.     Comments: Fibroids ~2cm above umbilicus on the right  Genitourinary:    General: Normal vulva.     Exam position: Lithotomy position.     Urethra: No prolapse.     Vagina: Normal. No vaginal discharge or bleeding.     Cervix: Normal. No lesion.     Uterus: Normal. Not enlarged and not tender.      Adnexa: Right adnexa normal and left adnexa normal.    Musculoskeletal:        General: Normal range of motion.     Cervical back: Normal range of motion.  Lymphadenopathy:     Upper Body:     Right upper body: No axillary adenopathy.     Left upper body: No axillary adenopathy.     Lower Body: No right inguinal adenopathy. No left inguinal adenopathy.  Skin:    General: Skin is warm and dry.  Neurological:     General: No focal deficit present.     Mental Status: She is alert.  Psychiatric:        Mood and Affect: Mood normal.        Behavior: Behavior normal.      Assessment and Plan:        Well woman exam with routine gynecological exam Assessment & Plan: Cervical cancer screening performed according to ASCCP guidelines. Encouraged annual mammogram screening Colonoscopy due, referral ordered Labs and immunizations with her primary Encouraged safe sexual practices as indicated Encouraged healthy lifestyle practices with diet and exercise For patients under 50yo, I recommend 1000mg  calcium daily and 600IU of vitamin D daily.   Orders: -     VITAMIN D 25 Hydroxy (Vit-D Deficiency, Fractures) -     Thyroid Panel With TSH -     Hemoglobin A1C w/out eAG -     CBC -     COMPLETE METABOLIC PANEL WITH GFR -     Lipid panel  Cervical cancer screening -     Cytology - PAP  Low grade squamous intraepithelial lesion (LGSIL) at risk for high grade squamous intraepithelial lesion (HGSIL) on cytologic smear of cervix -     Cytology - PAP  Uterine leiomyoma, unspecified location Assessment & Plan: 2023 [place] notes 16cm uterus, now ~22-24cm by exam  today Will repeat TVUS 08/2022 EMB benign Ultimately desires surgical management  Orders: -     US PELVIS TRANSVAGINAL NON-OB (TV ONLY); Future -     Drospirenone-Ethinyl Estradiol; Take 1 tablet by mouth daily.  Dispense: 84 tablet; Refill: 4  Menorrhagia with regular cycle Assessment & Plan: Start low dose COC for AUB Continue iron  Orders: -     US PELVIS TRANSVAGINAL NON-OB (TV ONLY); Future -  Drospirenone-Ethinyl Estradiol; Take 1 tablet by mouth daily.  Dispense: 84 tablet; Refill: 4  Vulvar ulcer -     Herpes simplex virus culture  Symptomatic anemia -     Iron, TIBC and Ferritin Panel -     Ambulatory referral to Hematology / Oncology  Colon cancer screening -     Ambulatory referral to Gastroenterology   Rosalyn Gess, MD

## 2024-03-18 NOTE — Addendum Note (Signed)
 Addended by: Rosalyn Gess on: 03/18/2024 09:42 AM   Modules accepted: Orders

## 2024-03-18 NOTE — Patient Instructions (Signed)

## 2024-03-19 ENCOUNTER — Encounter: Payer: Self-pay | Admitting: Obstetrics and Gynecology

## 2024-03-19 ENCOUNTER — Other Ambulatory Visit: Payer: Self-pay | Admitting: Obstetrics and Gynecology

## 2024-03-19 DIAGNOSIS — R7989 Other specified abnormal findings of blood chemistry: Secondary | ICD-10-CM

## 2024-03-19 LAB — HEMOGLOBIN A1C W/OUT EAG: Hgb A1c MFr Bld: 5.8 %{Hb} — ABNORMAL HIGH (ref <5.7)

## 2024-03-19 LAB — COMPLETE METABOLIC PANEL WITH GFR
AG Ratio: 1.3 (calc) (ref 1.0–2.5)
ALT: 7 U/L (ref 6–29)
AST: 15 U/L (ref 10–35)
Albumin: 4.2 g/dL (ref 3.6–5.1)
Alkaline phosphatase (APISO): 85 U/L (ref 31–125)
BUN: 15 mg/dL (ref 7–25)
CO2: 25 mmol/L (ref 20–32)
Calcium: 9.3 mg/dL (ref 8.6–10.2)
Chloride: 105 mmol/L (ref 98–110)
Creat: 0.81 mg/dL (ref 0.50–0.99)
Globulin: 3.3 g/dL (ref 1.9–3.7)
Glucose, Bld: 85 mg/dL (ref 65–99)
Potassium: 4.7 mmol/L (ref 3.5–5.3)
Sodium: 137 mmol/L (ref 135–146)
Total Bilirubin: 0.2 mg/dL (ref 0.2–1.2)
Total Protein: 7.5 g/dL (ref 6.1–8.1)

## 2024-03-19 LAB — IRON,TIBC AND FERRITIN PANEL
%SAT: 13 % — ABNORMAL LOW (ref 16–45)
Ferritin: 18 ng/mL (ref 16–232)
Iron: 45 ug/dL (ref 40–190)
TIBC: 343 ug/dL (ref 250–450)

## 2024-03-19 LAB — CBC
HCT: 35.1 % (ref 35.0–45.0)
Hemoglobin: 11.2 g/dL — ABNORMAL LOW (ref 11.7–15.5)
MCH: 26.3 pg — ABNORMAL LOW (ref 27.0–33.0)
MCHC: 31.9 g/dL — ABNORMAL LOW (ref 32.0–36.0)
MCV: 82.4 fL (ref 80.0–100.0)
MPV: 9.6 fL (ref 7.5–12.5)
Platelets: 337 10*3/uL (ref 140–400)
RBC: 4.26 10*6/uL (ref 3.80–5.10)
RDW: 14.6 % (ref 11.0–15.0)
WBC: 6.3 10*3/uL (ref 3.8–10.8)

## 2024-03-19 LAB — VITAMIN D 25 HYDROXY (VIT D DEFICIENCY, FRACTURES): Vit D, 25-Hydroxy: 9 ng/mL — ABNORMAL LOW (ref 30–100)

## 2024-03-19 LAB — THYROID PANEL WITH TSH
Free Thyroxine Index: 2.4 (ref 1.4–3.8)
T3 Uptake: 27 % (ref 22–35)
T4, Total: 9 ug/dL (ref 5.1–11.9)
TSH: 2.09 m[IU]/L

## 2024-03-19 LAB — LIPID PANEL
Cholesterol: 167 mg/dL (ref <200)
HDL: 47 mg/dL — ABNORMAL LOW (ref >=50)
LDL Cholesterol (Calc): 105 mg/dL — ABNORMAL HIGH
Non-HDL Cholesterol (Calc): 120 mg/dL (ref <130)
Total CHOL/HDL Ratio: 3.6 (calc) (ref <5.0)
Triglycerides: 65 mg/dL (ref <150)

## 2024-03-19 MED ORDER — VITAMIN D (ERGOCALCIFEROL) 1.25 MG (50000 UNIT) PO CAPS: 50000.0000 [IU] | ORAL_CAPSULE | ORAL | 0 refills | Status: AC

## 2024-03-20 LAB — HERPES SIMPLEX VIRUS CULTURE
MICRO NUMBER:: 16220789
SPECIMEN QUALITY:: ADEQUATE

## 2024-03-23 ENCOUNTER — Encounter: Payer: Self-pay | Admitting: Obstetrics and Gynecology

## 2024-03-23 DIAGNOSIS — R7303 Prediabetes: Secondary | ICD-10-CM | POA: Insufficient documentation

## 2024-03-23 DIAGNOSIS — E559 Vitamin D deficiency, unspecified: Secondary | ICD-10-CM | POA: Insufficient documentation

## 2024-03-24 LAB — CYTOLOGY - PAP
Comment: NEGATIVE
Diagnosis: NEGATIVE
High risk HPV: NEGATIVE

## 2024-04-22 ENCOUNTER — Ambulatory Visit: Admitting: Obstetrics and Gynecology

## 2024-04-22 ENCOUNTER — Ambulatory Visit

## 2024-04-22 ENCOUNTER — Encounter: Payer: Self-pay | Admitting: Obstetrics and Gynecology

## 2024-04-22 VITALS — BP 104/62 | HR 82 | Wt 181.0 lb

## 2024-04-22 DIAGNOSIS — N920 Excessive and frequent menstruation with regular cycle: Secondary | ICD-10-CM | POA: Diagnosis not present

## 2024-04-22 DIAGNOSIS — D259 Leiomyoma of uterus, unspecified: Secondary | ICD-10-CM

## 2024-04-22 DIAGNOSIS — D500 Iron deficiency anemia secondary to blood loss (chronic): Secondary | ICD-10-CM | POA: Diagnosis not present

## 2024-04-22 DIAGNOSIS — I471 Supraventricular tachycardia, unspecified: Secondary | ICD-10-CM

## 2024-04-22 MED ORDER — DROSPIRENONE-ETHINYL ESTRADIOL 3-0.02 MG PO TABS
ORAL_TABLET | ORAL | 0 refills | Status: AC
Start: 2024-04-22 — End: ?

## 2024-04-22 NOTE — Progress Notes (Signed)
 48 y.o. G0P0000 female with AUB-F, anemia requiring blood transfusion (2023 x1), endometriosis (s/p lap), vitamin D  deficiency, prediabetes, SVT (treated with adenosine, 11/07/2022) here for ultrasound follow-up. Single. Works in a [employer].   Patient's last menstrual period was 03/21/2024.   At annual exam on 03/18/2024, she reported HVB with regular cycles, 3/7d, using 6pads on her heaviest days. No IMB. She was started on COC at that time. Flow is like a normal period today. She is still having spotting and some clots at times. Has had a prolonged period since 3/22.  Birth control: COC Last mammogram: Never Sexually active: No    Component Value Date/Time   DIAGPAP  03/18/2024 0849    - Negative for intraepithelial lesion or malignancy (NILM)   DIAGPAP - Low grade squamous intraepithelial lesion (LSIL) (A) 09/10/2022 1147   DIAGPAP - See comment (A) 09/10/2022 1147   HPVHIGH Negative 03/18/2024 0849   HPVHIGH Negative 09/10/2022 1147   ADEQPAP  03/18/2024 0849    Satisfactory for evaluation; transformation zone component PRESENT.   ADEQPAP  09/10/2022 1147    Satisfactory for evaluation; transformation zone component PRESENT.   03/18/2024 hemoglobin 11.2 09/24/2022 pathology: ENDOMETRIUM, BIOPSY:  Benign late secretory phase endometrium. Benign endocervical epithelium and endocervix  Negative for polyp, breakdown, atypia, hyperplasia and carcinoma   GYN HISTORY: No significant history  OB History  Gravida Para Term Preterm AB Living  0 0 0 0 0 0  SAB IAB Ectopic Multiple Live Births  0 0 0 0 0    Past Medical History:  Diagnosis Date   Anemia    Elevated hemoglobin A1c    Endometriosis    Fibroid uterus    Low vitamin D  level    PID (pelvic inflammatory disease)    Trichimoniasis     Past Surgical History:  Procedure Laterality Date   LAPAROSCOPIC ENDOMETRIOSIS FULGURATION      Current Outpatient Medications on File Prior to Visit  Medication Sig Dispense  Refill   ferrous sulfate  325 (65 FE) MG tablet Take 1 tablet (325 mg total) by mouth 3 (three) times daily with meals. 90 tablet 0   Vitamin D , Ergocalciferol , (DRISDOL ) 1.25 MG (50000 UNIT) CAPS capsule Take 1 capsule (50,000 Units total) by mouth every 7 (seven) days. 8 capsule 0   No current facility-administered medications on file prior to visit.    No Known Allergies    PE Today's Vitals   04/22/24 0819  BP: 104/62  Pulse: 82  SpO2: 99%  Weight: 181 lb (82.1 kg)   Body mass index is 30.83 kg/m.  Physical Exam Vitals reviewed.  Constitutional:      General: She is not in acute distress.    Appearance: Normal appearance.  HENT:     Head: Normocephalic and atraumatic.     Nose: Nose normal.  Eyes:     Extraocular Movements: Extraocular movements intact.     Conjunctiva/sclera: Conjunctivae normal.  Cardiovascular:     Rate and Rhythm: Normal rate and regular rhythm.     Heart sounds: No murmur heard.    No friction rub. No gallop.  Pulmonary:     Effort: Pulmonary effort is normal.     Breath sounds: Normal breath sounds. No wheezing, rhonchi or rales.  Abdominal:     General: There is distension.     Palpations: There is mass.     Comments:  Fibroids ~2cm above umbilicus on the right   Musculoskeletal:  General: Normal range of motion.     Cervical back: Normal range of motion.  Neurological:     General: No focal deficit present.     Mental Status: She is alert.  Psychiatric:        Mood and Affect: Mood normal.        Behavior: Behavior normal.     04/29/24 TVUS: Indications: AUB, fibroids Comparison: 08/23/2022  Findings:   Uterus: 22.6 x 12.0 x 12.4 cm, multifibroid uterus. Fibroids: 1-4.3 x 3.4 cm, intramural 2-5.3 x 4.7 cm, intramural 3-3.9 x 3.3 cm, intramural 4-3.9 x 3.9 cm, intramural 5-4.1 x 4.0 cm, intramural 6-6.8 x 5.7 cm, subserosal 7-6.4 x 4.0 cm, subserosal 8-1.8 x 1.6 cm, subserosal 9-4.4 x 2.7 cm, subserosal Endometrial  thickness: 13 mm. Left ovary: 2.1 x 1.5 x 1.3 cm, normal-appearing. Right ovary: 2.5 x 1.9 x 1.4 cm, normal-appearing. No free fluid.  Impression:  Multifibroid uterus, enlarged compared to prior ultrasound on 08/23/2022.  Romaine Closs, MD    Assessment and Plan:        Uterine leiomyoma, unspecified location Assessment & Plan: 2023 US  notes 16cm uterus,  Reviewed TVUS with patient today revealing 22 cm uterus, 9 fibroids measured.  The largest fibroid measured 6.8 cm.   Discussed pathology of fibroids and benign nature. Reviewed that management of fibroids is dependent upon associated symptoms. Fibroids can cause AUB and bulk symptoms, including dysmenorrhea, pelvic and lower back pain and pressure, dyspareunia, and constipation.  Reviewed hormonal and nonhormonal management, including the Mirena IUD and lysteda. Also reviewed interventional procedures like [place] and surgical management including myomectomy and hysterectomy.  She has elected for hysterectomy.Plan for TAH, reviewed observation and 6 to 8-week recovery with lifting and activity restrictions. Plan for continued hormonal management until surgery. Offered Myfembree for improved bleeding profile and debulking of fibroids. Declined Myfembree, plan for OCP taper for ongoing bleeding. Return office for EMB given increased uterine and fibroid volume Will need cardiac clearance given history of SVT, cardiology referral placed Continue p.o. iron, hematology referral placed. 03/18/2024 hemoglobin 11.2  Orders: -     Drospirenone -Ethinyl Estradiol ; Take 2 tablets by mouth daily for 7 days, then decrease to 1 tab daily by mouth. Skip placebo pills.  Dispense: 28 tablet; Refill: 0 -     Endometrial biopsy; Future -     Ambulatory Referral For Surgery Scheduling  Menorrhagia with regular cycle -     Drospirenone -Ethinyl Estradiol ; Take 2 tablets by mouth daily for 7 days, then decrease to 1 tab daily by mouth. Skip placebo pills.   Dispense: 28 tablet; Refill: 0 -     Endometrial biopsy; Future -     Ambulatory Referral For Surgery Scheduling  Iron deficiency anemia due to chronic blood loss -     Ambulatory referral to Hematology / Oncology  SVT (supraventricular tachycardia) (HCC) -     Ambulatory referral to Cardiology    Romaine Closs, MD

## 2024-04-28 NOTE — Progress Notes (Signed)
 [facility] CONSULT NOTE  Patient Care Team: Pcp, No as PCP - General  ASSESSMENT & PLAN:  Da[facility] is a 48 y.o. female with history of AUB, Uterine leiomyoma being seen for iron deficiency anemia.  Relevant history: History of AUB and fibroid. Last colonoscopy: not yet. Periods: heavy  Recent labs showed improvement of IDA on oral therapy. Will continue oral supplement. Repeat in about 3-4 months.  Assessment & Plan Iron deficiency anemia due to chronic blood loss CBC, Iron, TIBC, ferritin in 3 - 4 months Continue oral ferrous sulfate  325 mg daily with vit C tab separately from calcium  product like milk, or supplement.  Orders Placed This Encounter  Procedures   CBC with Differential (Cancer Center Only)    Standing Status:   Future    Number of Occurrences:   1    Expiration Date:   05/04/2025   Ferritin    Standing Status:   Future    Number of Occurrences:   1    Expiration Date:   05/04/2025   Iron and Iron Binding Capacity ([facility] only)    Standing Status:   Future    Number of Occurrences:   1    Expiration Date:   05/04/2025      All questions were answered. The patient knows to call the clinic with any problems, questions or concerns.  Dawn Ruddy, MD 5/5/20254:08 PM   CHIEF COMPLAINTS/PURPOSE OF CONSULTATION:  Anemia  HISTORY OF PRESENTING ILLNESS:  Dawn Lyons 48 y.o. female is here because of anemia.  Report she had blood transfusion for iron deficiency anemia. She has history of heavy menstrual cycles.   She started birth control in April 2025. She used to bleed about 3 days of very heavy bleeding. Growing up it was not as heavy.   02/2024 iron sat 13%. Ferritin 18. Hgb 11. MCV 82.  Dawn Lyons had not noticed any recent bleeding such as melena, hematuria or hematochezia.  No nosebleed.  No colonoscopy yet.  The patient has been taking oral iron daily since 2023.   She has elected hysterectomy.  MEDICAL HISTORY:  Past Medical  History:  Diagnosis Date   Anemia    Elevated hemoglobin A1c    Endometriosis    Fibroid uterus    Low vitamin D  level    PID (pelvic inflammatory disease)    Trichimoniasis     SURGICAL HISTORY: Past Surgical History:  Procedure Laterality Date   LAPAROSCOPIC ENDOMETRIOSIS FULGURATION      SOCIAL HISTORY: Social History   Socioeconomic History   Marital status: Single    Spouse name: Not on file   Number of children: Not on file   Years of education: Not on file   Highest education level: Not on file  Occupational History   Not on file  Tobacco Use   Smoking status: Never   Smokeless tobacco: Never  Vaping Use   Vaping status: Never Used  Substance and Sexual Activity   Alcohol use: No   Drug use: No   Sexual activity: Not Currently    Birth control/protection: None  Other Topics Concern   Not on file  Social History Narrative   Not on file   Social Drivers of Health   Financial Resource Strain: Not on file  Food Insecurity: Not on file  Transportation Needs: Not on file  Physical Activity: Not on file  Stress: Not on file  Social Connections: Not on file  Intimate Partner Violence: Not  on file    FAMILY HISTORY: Family History  Problem Relation Age of Onset   Stroke Mother    Hypertension Mother    Hypertension Sister    Diabetes Maternal Grandmother     ALLERGIES:  has no known allergies.  MEDICATIONS:  Current Outpatient Medications  Medication Sig Dispense Refill   drospirenone -ethinyl estradiol  (YAZ) 3-0.02 MG tablet Take 2 tablets by mouth daily for 7 days, then decrease to 1 tab daily by mouth. Skip placebo pills. 28 tablet 0   ferrous sulfate  325 (65 FE) MG tablet Take 1 tablet (325 mg total) by mouth 3 (three) times daily with meals. 90 tablet 0   Vitamin D , Ergocalciferol , (DRISDOL ) 1.25 MG (50000 UNIT) CAPS capsule Take 1 capsule (50,000 Units total) by mouth every 7 (seven) days. 8 capsule 0   No current facility-administered  medications for this visit.    REVIEW OF SYSTEMS:   All relevant systems were reviewed with the patient and are negative.  PHYSICAL EXAMINATION:  Vitals:   05/04/24 0843  BP: 118/85  Pulse: 89  Resp: 18  Temp: 99.1 F (37.3 C)  SpO2: 100%   Filed Weights   05/04/24 0843  Weight: 179 lb 4.8 oz (81.3 kg)    GENERAL: alert, no distress and comfortable  RADIOGRAPHIC STUDIES: I have personally reviewed the radiological images as listed and agreed with the findings in the report. US  Pelvis Complete Result Date: 04/29/2024 Indications: AUB, fibroids Comparison: 08/23/2022 Findings: Uterus: 22.6 x 12.0 x 12.4 cm, multifibroid uterus. Fibroids: 1-4.3 x 3.4 cm, intramural 2-5.3 x 4.7 cm, intramural 3-3.9 x 3.3 cm, intramural 4-3.9 x 3.9 cm, intramural 5-4.1 x 4.0 cm, intramural 6-6.8 x 5.7 cm, subserosal 7-6.4 x 4.0 cm, subserosal 8-1.8 x 1.6 cm, subserosal 9-4.4 x 2.7 cm, subserosal Endometrial thickness: 13 mm. Left ovary: 2.1 x 1.5 x 1.3 cm, normal-appearing. Right ovary: 2.5 x 1.9 x 1.4 cm, normal-appearing. No free fluid. Impression: Multifibroid uterus, enlarged compared to prior ultrasound on 08/23/2022. Dawn Closs, MD

## 2024-04-29 NOTE — Assessment & Plan Note (Signed)
 2023 US  notes 16cm uterus,  Reviewed TVUS with patient today revealing 22 cm uterus, 9 fibroids measured.  The largest fibroid measured 6.8 cm.   Discussed pathology of fibroids and benign nature. Reviewed that management of fibroids is dependent upon associated symptoms. Fibroids can cause AUB and bulk symptoms, including dysmenorrhea, pelvic and lower back pain and pressure, dyspareunia, and constipation.  Reviewed hormonal and nonhormonal management, including the Mirena IUD and lysteda. Also reviewed interventional procedures like [place] and surgical management including myomectomy and hysterectomy.  She has elected for hysterectomy.Plan for TAH, reviewed observation and 6 to 8-week recovery with lifting and activity restrictions. Plan for continued hormonal management until surgery. Offered Myfembree for improved bleeding profile and debulking of fibroids. Declined Myfembree, plan for OCP taper for ongoing bleeding. Return office for EMB given increased uterine and fibroid volume Will need cardiac clearance given history of SVT, cardiology referral placed Continue p.o. iron, hematology referral placed. 03/18/2024 hemoglobin 11.2

## 2024-05-04 ENCOUNTER — Telehealth: Payer: Self-pay | Admitting: *Deleted

## 2024-05-04 ENCOUNTER — Inpatient Hospital Stay: Payer: Self-pay

## 2024-05-04 VITALS — BP 118/85 | HR 89 | Temp 99.1°F | Resp 18 | Ht 64.25 in | Wt 179.3 lb

## 2024-05-04 DIAGNOSIS — D500 Iron deficiency anemia secondary to blood loss (chronic): Secondary | ICD-10-CM

## 2024-05-04 DIAGNOSIS — D259 Leiomyoma of uterus, unspecified: Secondary | ICD-10-CM | POA: Insufficient documentation

## 2024-05-04 LAB — IRON AND IRON BINDING CAPACITY (CC-WL,HP ONLY)
Iron: 42 ug/dL (ref 28–170)
Saturation Ratios: 10 % — ABNORMAL LOW (ref 10.4–31.8)
TIBC: 420 ug/dL (ref 250–450)
UIBC: 378 ug/dL (ref 148–442)

## 2024-05-04 LAB — CBC WITH DIFFERENTIAL (CANCER CENTER ONLY)
Abs Immature Granulocytes: 0.01 10*3/uL (ref 0.00–0.07)
Basophils Absolute: 0 10*3/uL (ref 0.0–0.1)
Basophils Relative: 0 %
Eosinophils Absolute: 0 10*3/uL (ref 0.0–0.5)
Eosinophils Relative: 1 %
HCT: 37.2 % (ref 36.0–46.0)
Hemoglobin: 12.2 g/dL (ref 12.0–15.0)
Immature Granulocytes: 0 %
Lymphocytes Relative: 33 %
Lymphs Abs: 1.2 10*3/uL (ref 0.7–4.0)
MCH: 26.6 pg (ref 26.0–34.0)
MCHC: 32.8 g/dL (ref 30.0–36.0)
MCV: 81.2 fL (ref 80.0–100.0)
Monocytes Absolute: 0.4 10*3/uL (ref 0.1–1.0)
Monocytes Relative: 10 %
Neutro Abs: 2 10*3/uL (ref 1.7–7.7)
Neutrophils Relative %: 56 %
Platelet Count: 257 10*3/uL (ref 150–400)
RBC: 4.58 MIL/uL (ref 3.87–5.11)
RDW: 14.4 % (ref 11.5–15.5)
WBC Count: 3.6 10*3/uL — ABNORMAL LOW (ref 4.0–10.5)
nRBC: 0 % (ref 0.0–0.2)

## 2024-05-04 LAB — FERRITIN: Ferritin: 24 ng/mL (ref 11–307)

## 2024-05-04 NOTE — Telephone Encounter (Signed)
-----   Message from [name] sent at 05/04/2024  4:13 PM EDT ----- Please let her know IDA improved. Continue oral iron daily with a vit C tab. (Not with any calcium  product) follow up in about 3 months, with lab 1-2 days before visit. I ordered the labs. Thanks.

## 2024-05-04 NOTE — Assessment & Plan Note (Signed)
 CBC, Iron, TIBC, ferritin in 3 - 4 months Continue oral ferrous sulfate  325 mg daily with vit C tab separately from calcium  product like milk, or supplement.

## 2024-05-04 NOTE — Telephone Encounter (Signed)
 Notified of message below. Verbalized understanding

## 2024-05-11 ENCOUNTER — Encounter: Payer: Self-pay | Admitting: Obstetrics and Gynecology

## 2024-05-11 ENCOUNTER — Other Ambulatory Visit (HOSPITAL_COMMUNITY)
Admission: RE | Admit: 2024-05-11 | Discharge: 2024-05-11 | Disposition: A | Discharge status: Home / Self Care | Source: Ambulatory Visit | Attending: Obstetrics and Gynecology | Admitting: Obstetrics and Gynecology

## 2024-05-11 ENCOUNTER — Ambulatory Visit: Admitting: Obstetrics and Gynecology

## 2024-05-11 VITALS — BP 124/80 | HR 80 | Temp 98.4°F | Wt 174.6 lb

## 2024-05-11 DIAGNOSIS — D259 Leiomyoma of uterus, unspecified: Secondary | ICD-10-CM | POA: Insufficient documentation

## 2024-05-11 DIAGNOSIS — I471 Supraventricular tachycardia, unspecified: Secondary | ICD-10-CM

## 2024-05-11 DIAGNOSIS — N920 Excessive and frequent menstruation with regular cycle: Secondary | ICD-10-CM | POA: Diagnosis present

## 2024-05-11 DIAGNOSIS — Z01812 Encounter for preprocedural laboratory examination: Secondary | ICD-10-CM

## 2024-05-11 DIAGNOSIS — D500 Iron deficiency anemia secondary to blood loss (chronic): Secondary | ICD-10-CM | POA: Diagnosis not present

## 2024-05-11 LAB — PREGNANCY, URINE: Preg Test, Ur: NEGATIVE

## 2024-05-11 NOTE — Progress Notes (Signed)
 48 y.o. G0P0000 female with AUB-F, anemia requiring blood transfusion (2023 x1), endometriosis (s/p lap), vitamin D  deficiency, prediabetes, SVT (treated with adenosine, 11/07/2022) here for EMB. Single. Works in a [employer].   Patient's last menstrual period was 04/16/2024 (approximate). Period Pattern: (!) Irregular Menstrual Flow: Heavy Menstrual Control: Maxi pad Dysmenorrhea: None Dysmenorrhea Symptoms: Headache, Diarrhea  At annual exam on 03/18/2024, she reported HVB with regular cycles, 3/7d, using 6pads on her heaviest days. No IMB. She was started on COC at that time. Flow is like a heavy period today. She is still having spotting and some clots at times. Bleeding last longer on COC. Declined myfembree at last appt. Has seen hematology: 05/04/24 Hgb 12.2, continue on oral iron  Birth control: COC Last mammogram: Never Sexually active: No    Component Value Date/Time   DIAGPAP  03/18/2024 0849    - Negative for intraepithelial lesion or malignancy (NILM)   DIAGPAP - Low grade squamous intraepithelial lesion (LSIL) (A) 09/10/2022 1147   DIAGPAP - See comment (A) 09/10/2022 1147   HPVHIGH Negative 03/18/2024 0849   HPVHIGH Negative 09/10/2022 1147   ADEQPAP  03/18/2024 0849    Satisfactory for evaluation; transformation zone component PRESENT.   ADEQPAP  09/10/2022 1147    Satisfactory for evaluation; transformation zone component PRESENT.   03/18/2024 hemoglobin 11.2 09/24/2022 pathology: ENDOMETRIUM, BIOPSY:  Benign late secretory phase endometrium. Benign endocervical epithelium and endocervix  Negative for polyp, breakdown, atypia, hyperplasia and carcinoma   GYN HISTORY: No significant history  OB History  Gravida Para Term Preterm AB Living  0 0 0 0 0 0  SAB IAB Ectopic Multiple Live Births  0 0 0 0 0    Past Medical History:  Diagnosis Date   Anemia    Elevated hemoglobin A1c    Endometriosis    Fibroid uterus    Low vitamin D  level    PID (pelvic  inflammatory disease)    Trichimoniasis     Past Surgical History:  Procedure Laterality Date   LAPAROSCOPIC ENDOMETRIOSIS FULGURATION      Current Outpatient Medications on File Prior to Visit  Medication Sig Dispense Refill   drospirenone -ethinyl estradiol  (YAZ) 3-0.02 MG tablet Take 2 tablets by mouth daily for 7 days, then decrease to 1 tab daily by mouth. Skip placebo pills. 28 tablet 0   Vitamin D , Ergocalciferol , (DRISDOL ) 1.25 MG (50000 UNIT) CAPS capsule Take 1 capsule (50,000 Units total) by mouth every 7 (seven) days. 8 capsule 0   ferrous sulfate  325 (65 FE) MG tablet Take 1 tablet (325 mg total) by mouth 3 (three) times daily with meals. 90 tablet 0   No current facility-administered medications on file prior to visit.    No Known Allergies    PE Today's Vitals   05/11/24 0838  BP: 124/80  Pulse: 80  Temp: 98.4 F (36.9 C)  TempSrc: Oral  SpO2: 99%  Weight: 174 lb 9.6 oz (79.2 kg)    Body mass index is 29.74 kg/m.  Physical Exam Vitals reviewed. Exam conducted with a chaperone present.  Constitutional:      General: She is not in acute distress.    Appearance: Normal appearance.  HENT:     Head: Normocephalic and atraumatic.     Nose: Nose normal.  Eyes:     Extraocular Movements: Extraocular movements intact.     Conjunctiva/sclera: Conjunctivae normal.  Cardiovascular:     Rate and Rhythm: Normal rate.  Pulmonary:     Effort:  Pulmonary effort is normal.  Abdominal:     General: There is distension.     Palpations: There is mass.     Comments:  Fibroids ~2cm above umbilicus on the right   Genitourinary:    General: Normal vulva.     Exam position: Lithotomy position.     Vagina: Normal. No vaginal discharge.     Cervix: Normal. No cervical motion tenderness, discharge or lesion.     Uterus: Normal. Enlarged. Not tender.      Adnexa: Right adnexa normal and left adnexa normal.  Musculoskeletal:        General: Normal range of motion.      Cervical back: Normal range of motion.  Neurological:     General: No focal deficit present.     Mental Status: She is alert.  Psychiatric:        Mood and Affect: Mood normal.        Behavior: Behavior normal.     04/29/24 TVUS: Indications: AUB, fibroids Comparison: 08/23/2022  Findings:   Uterus: 22.6 x 12.0 x 12.4 cm, multifibroid uterus. Fibroids: 1-4.3 x 3.4 cm, intramural 2-5.3 x 4.7 cm, intramural 3-3.9 x 3.3 cm, intramural 4-3.9 x 3.9 cm, intramural 5-4.1 x 4.0 cm, intramural 6-6.8 x 5.7 cm, subserosal 7-6.4 x 4.0 cm, subserosal 8-1.8 x 1.6 cm, subserosal 9-4.4 x 2.7 cm, subserosal Endometrial thickness: 13 mm. Left ovary: 2.1 x 1.5 x 1.3 cm, normal-appearing. Right ovary: 2.5 x 1.9 x 1.4 cm, normal-appearing. No free fluid.  Impression:  Multifibroid uterus, enlarged compared to prior ultrasound on 08/23/2022.  Romaine Closs, MD  Procedure Consent was signed. Timeout was performed. Speculum inserted into the vagina, cervix visualized and was prepped with Betadine.  A single-toothed tenaculum was placed on the anterior lip of the cervix to stabilize it.  The 3 mm pipelle was introduced into the endometrial cavity without difficulty to a depth of 10cm, suction initiated and a moderate amount of tissue was obtained and sent to pathology.  The instruments were removed from the patient's vagina.  Minimal bleeding from the cervix was noted.  The patient tolerated the procedure well.     Assessment and Plan:        Uterine leiomyoma, unspecified location Assessment & Plan: 04/29/2024 TVUS with 22 cm uterus, 9 fibroids measured.  The largest fibroid measured 6.8 cm.  This is increased from 2023 ultrasound the uterus measured 16 cm.  Patient with AUB F poorly controlled with COC.  Declined Myfembree at last appointment.  Presented for uncomplicated endometrial biopsy today. 05/04/24 Hgb 12.2, continue following with hematology.  Continue p.o. iron.  Plan for  TAH. Awaiting cardiac clearance given history of SVT, cardiology referral placed.  Number for [employer]. provided today   Orders: -     Surgical pathology  Menorrhagia with regular cycle -     Surgical pathology  Iron deficiency anemia due to chronic blood loss  SVT (supraventricular tachycardia) (HCC)  Pre-procedure lab exam -     Pregnancy, urine     Romaine Closs, MD

## 2024-05-11 NOTE — Patient Instructions (Signed)
 It is common to have vaginal bleeding and cramping for up to 72 hours after your biopsy. Please call our office with heavy vaginal bleeding, severe abdominal pain or fever. Avoid intercourse, tampon use, douching and baths for 7 days to decrease the risk of infection.

## 2024-05-11 NOTE — Assessment & Plan Note (Signed)
 04/29/2024 TVUS with 22 cm uterus, 9 fibroids measured.  The largest fibroid measured 6.8 cm.  This is increased from 2023 ultrasound the uterus measured 16 cm.  Patient with AUB F poorly controlled with COC.  Declined Myfembree at last appointment.  Presented for uncomplicated endometrial biopsy today. 05/04/24 Hgb 12.2, continue following with hematology.  Continue p.o. iron.  Plan for TAH. Awaiting cardiac clearance given history of SVT, cardiology referral placed.  Number for [employer]. provided today

## 2024-05-12 ENCOUNTER — Encounter: Payer: Self-pay | Admitting: Obstetrics and Gynecology

## 2024-05-12 ENCOUNTER — Ambulatory Visit: Payer: Self-pay | Admitting: Obstetrics and Gynecology

## 2024-05-12 ENCOUNTER — Ambulatory Visit: Admitting: Obstetrics and Gynecology

## 2024-05-12 VITALS — BP 124/78 | HR 81

## 2024-05-12 DIAGNOSIS — D259 Leiomyoma of uterus, unspecified: Secondary | ICD-10-CM

## 2024-05-12 DIAGNOSIS — N920 Excessive and frequent menstruation with regular cycle: Secondary | ICD-10-CM

## 2024-05-12 DIAGNOSIS — D500 Iron deficiency anemia secondary to blood loss (chronic): Secondary | ICD-10-CM | POA: Diagnosis not present

## 2024-05-12 LAB — SURGICAL PATHOLOGY

## 2024-05-12 MED ORDER — MYFEMBREE 40-1-0.5 MG PO TABS
1.0000 | ORAL_TABLET | Freq: Every day | ORAL | 11 refills | Status: AC
Start: 2024-05-12 — End: ?

## 2024-05-12 MED ORDER — ONDANSETRON 4 MG PO TBDP: 4.0000 mg | ORAL_TABLET | Freq: Three times a day (TID) | ORAL | 0 refills | Status: AC | PRN

## 2024-05-12 NOTE — Progress Notes (Signed)
 48 y.o. G0P0000 female with AUB-F, anemia requiring blood transfusion (2023 x1), endometriosis (s/p lap), vitamin D  deficiency, prediabetes, SVT (treated with adenosine, 11/07/2022) here for EMB. Single. Works in a [employer].   Patient's last menstrual period was 05/07/2024 (approximate).   At annual exam on 03/18/2024, she reported HVB with regular cycles, 3/7d, using 6pads on her heaviest days. No IMB. She was started on COC at that time. Flow is like a heavy period today. She is still having spotting and some clots at times. Bleeding last longer on COC. Declined myfembree at prior appt. Did COC taper in April- no improvement Has seen hematology: 05/04/24 Hgb 12.2, continue on oral iron Cycle started 05/07/24. Presented for EMB yesterday and has passed large clots with HVB since. Soaked through 4 maxi pads and onto car seat. Today having menstrual cramping and still bleeding. This monring felt light headed in the shower. Feels weak.  Works nights, requesting time out of work.  Birth control: COC Last mammogram: Never Sexually active: No    Component Value Date/Time   DIAGPAP  03/18/2024 0849    - Negative for intraepithelial lesion or malignancy (NILM)   DIAGPAP - Low grade squamous intraepithelial lesion (LSIL) (A) 09/10/2022 1147   DIAGPAP - See comment (A) 09/10/2022 1147   HPVHIGH Negative 03/18/2024 0849   HPVHIGH Negative 09/10/2022 1147   ADEQPAP  03/18/2024 0849    Satisfactory for evaluation; transformation zone component PRESENT.   ADEQPAP  09/10/2022 1147    Satisfactory for evaluation; transformation zone component PRESENT.   03/18/2024 hemoglobin 11.2 09/24/2022 pathology: ENDOMETRIUM, BIOPSY:  Benign late secretory phase endometrium. Benign endocervical epithelium and endocervix  Negative for polyp, breakdown, atypia, hyperplasia and carcinoma   GYN HISTORY: No significant history  OB History  Gravida Para Term Preterm AB Living  0 0 0 0 0 0  SAB IAB Ectopic  Multiple Live Births  0 0 0 0 0    Past Medical History:  Diagnosis Date   Anemia    Elevated hemoglobin A1c    Endometriosis    Fibroid uterus    Low vitamin D  level    PID (pelvic inflammatory disease)    Trichimoniasis     Past Surgical History:  Procedure Laterality Date   LAPAROSCOPIC ENDOMETRIOSIS FULGURATION      Current Outpatient Medications on File Prior to Visit  Medication Sig Dispense Refill   drospirenone -ethinyl estradiol  (YAZ) 3-0.02 MG tablet Take 2 tablets by mouth daily for 7 days, then decrease to 1 tab daily by mouth. Skip placebo pills. 28 tablet 0   ferrous sulfate  325 (65 FE) MG tablet Take 1 tablet (325 mg total) by mouth 3 (three) times daily with meals. 90 tablet 0   Vitamin D , Ergocalciferol , (DRISDOL ) 1.25 MG (50000 UNIT) CAPS capsule Take 1 capsule (50,000 Units total) by mouth every 7 (seven) days. 8 capsule 0   No current facility-administered medications on file prior to visit.    No Known Allergies    PE Today's Vitals   05/12/24 1204  BP: 124/78  Pulse: 81  SpO2: 99%    There is no height or weight on file to calculate BMI.  Physical Exam Vitals reviewed. Exam conducted with a chaperone present.  Constitutional:      General: She is not in acute distress.    Appearance: Normal appearance.  HENT:     Head: Normocephalic and atraumatic.     Nose: Nose normal.  Eyes:     Extraocular Movements:  Extraocular movements intact.     Conjunctiva/sclera: Conjunctivae normal.  Cardiovascular:     Rate and Rhythm: Normal rate.  Pulmonary:     Effort: Pulmonary effort is normal.  Abdominal:     General: There is distension.     Palpations: There is mass.     Comments:  Fibroids ~2cm above umbilicus on the right   Genitourinary:    General: Normal vulva.     Exam position: Lithotomy position.     Vagina: Normal. No vaginal discharge.     Cervix: Normal. No cervical motion tenderness, discharge or lesion.     Uterus: Normal.  Enlarged. Not tender.      Adnexa: Right adnexa normal and left adnexa normal.     Comments: Heavy menses, requiring 4 scopettes to clear bleeding, No hemorrhaging present, no clots Musculoskeletal:        General: Normal range of motion.     Cervical back: Normal range of motion.  Neurological:     General: No focal deficit present.     Mental Status: She is alert.  Psychiatric:        Mood and Affect: Mood normal.        Behavior: Behavior normal.     Assessment and Plan:        Menorrhagia with regular cycle Assessment & Plan: Presents with HVB during menses and following EMB, suspect heavy meneses as patient was started on taper last months without improvement  Vital signs stable.  Most recent hemoglobin normal Failed COC, started 2 months ago without improvement including COC taper Recommend second line therapy with GnRH antagonist, or agonist if not covered Rx for myfembree sent to [facility] Patient to take COC TID x3d, BID x7d, then switch to [facility] Rx for zofran  sent Plan for hysterectomy, however awaiting cardiology referral for clearance given hx of SVT  Orders: -     Myfembree; Take 1 tablet by mouth daily.  Dispense: 28 tablet; Refill: 11 -     Ondansetron ; Take 1 tablet (4 mg total) by mouth every 8 (eight) hours as needed for nausea or vomiting.  Dispense: 30 tablet; Refill: 0  Uterine leiomyoma, unspecified location -     Myfembree; Take 1 tablet by mouth daily.  Dispense: 28 tablet; Refill: 11  Iron deficiency anemia due to chronic blood loss  Work note provided until next Monday.  Patient to return office if bleeding is unimproved.    Romaine Closs, MD

## 2024-05-12 NOTE — Assessment & Plan Note (Addendum)
 Presents with HVB during menses and following EMB, suspect heavy meneses as patient was started on taper last months without improvement  Vital signs stable.  Most recent hemoglobin normal Failed COC, started 2 months ago without improvement including COC taper Recommend second line therapy with GnRH antagonist, or agonist if not covered Rx for myfembree sent to [facility] Patient to take COC TID x3d, BID x7d, then switch to [facility] Rx for zofran  sent Plan for hysterectomy, however awaiting cardiology referral for clearance given hx of SVT

## 2024-05-12 NOTE — Patient Instructions (Addendum)
 Take 1 tablet of yaz 3 times a day for the next 3 days. Then take 1 tablet twice a day for the next 7 days.  Take ibuprofen  for pain.  Zofran  prescription was sent to your pharmacy and Myfembree will be sent into her mail-in pharmacy called [facility].

## 2024-05-12 NOTE — Telephone Encounter (Signed)
 Spoke with patient, OV scheduled for today at 1200 PM with Dr. Andrena Ke.   Encounter closed.

## 2024-05-12 NOTE — Telephone Encounter (Signed)
 Patient also left message on triage line this morning requesting return call and doctors note.   Spoke with patient. S/p EMB on 05/11/24.  Menses started on 05/07/24. Patient reports she is changing saturated pad q30 min with large clots. Denies signs/symptoms of anemia. Reports diarrhea with menses, states she does typically have diarrhea on the first day of menses. Denies N/V, fever/chills.   Denies pain at this time. Has been taking tylenol  500 mg PRN for cramping. Has not taken any this morning due to no pain. States she is unable to take motrin .   Patient is requesting a note for work for the remainder of the week due to bleeding. States she works at night, scheduled to return tonight.   Patient is available for OV if recommended.   Advised I will review with Dr. Andrena Ke and f/u.   Dr. Andrena Ke -please review. I have held the 12:00 OV if needed.

## 2024-05-13 ENCOUNTER — Telehealth: Payer: Self-pay | Admitting: *Deleted

## 2024-05-13 NOTE — Telephone Encounter (Signed)
 Spoke with patient. Patient states she was not requesting FMLA, states she was requesting short-term disability. Recommended patient f/u with employer to determine what it is she need for time out of work and f/u. Patient aware she will need to complete release of information and advised of fee for completion of forms.   Surgery referral also discussed.   Patient verbalizes understanding and is agreeable.

## 2024-05-13 NOTE — Telephone Encounter (Signed)
 FMLA forms received in office.   Patient was seen in office on 05/12/24, work note provided until 05/18/24.   Dr. Andrena Ke -was FMLA discussed?

## 2024-05-19 ENCOUNTER — Encounter: Payer: Self-pay | Admitting: Obstetrics and Gynecology

## 2024-05-21 NOTE — Telephone Encounter (Signed)
 [facility]ie Doctor -can you assist with cardiology referral. See my notes located in surgery referral.

## 2024-06-02 ENCOUNTER — Other Ambulatory Visit (HOSPITAL_COMMUNITY): Payer: Self-pay | Admitting: Cardiology

## 2024-06-02 DIAGNOSIS — I471 Supraventricular tachycardia, unspecified: Secondary | ICD-10-CM

## 2024-06-03 DIAGNOSIS — Z0289 Encounter for other administrative examinations: Secondary | ICD-10-CM

## 2024-06-08 NOTE — Telephone Encounter (Signed)
 See surgery referral, encounter closed.

## 2024-06-16 ENCOUNTER — Other Ambulatory Visit: Payer: Self-pay | Admitting: Obstetrics and Gynecology

## 2024-06-16 DIAGNOSIS — E559 Vitamin D deficiency, unspecified: Secondary | ICD-10-CM

## 2024-06-17 ENCOUNTER — Ambulatory Visit (HOSPITAL_COMMUNITY)
Admission: RE | Admit: 2024-06-17 | Discharge: 2024-06-17 | Disposition: A | Discharge status: Home / Self Care | Payer: Self-pay | Source: Ambulatory Visit | Attending: Cardiology | Admitting: Cardiology

## 2024-06-17 DIAGNOSIS — I471 Supraventricular tachycardia, unspecified: Secondary | ICD-10-CM | POA: Insufficient documentation

## 2024-06-17 DIAGNOSIS — I369 Nonrheumatic tricuspid valve disorder, unspecified: Secondary | ICD-10-CM

## 2024-06-17 DIAGNOSIS — Z006 Encounter for examination for normal comparison and control in clinical research program: Secondary | ICD-10-CM

## 2024-06-17 LAB — ECHOCARDIOGRAM COMPLETE
AR max vel: 1.73 cm2
AV Area VTI: 1.59 cm2
AV Area mean vel: 1.51 cm2
AV Mean grad: 6 mmHg
AV Peak grad: 13.7 mmHg
Ao pk vel: 1.85 m/s
Area-P 1/2: 2.73 cm2
Calc EF: 70.2 %
S' Lateral: 2 cm
Single Plane A2C EF: 69 %
Single Plane A4C EF: 69.6 %

## 2024-06-17 NOTE — Progress Notes (Signed)
*  PRELIMINARY RESULTS* Echocardiogram 2D Echocardiogram has been performed.  Dawn Lyons 06/17/2024, 11:42 AM

## 2024-06-17 NOTE — Research (Signed)
 SITE: [id number]     Subject # [id number]    Subprotocol: A  Inclusion Criteria  Patients who meet all of the following criteria are eligible for enrollment as study participants:  Yes No  Age > 46 years old X   Eligible to wear Holter Study X    Exclusion Criteria  Patients who meet any of these criteria are not eligible for enrollment as study participants: Yes No  1. Receiving any mechanical (respiratory or circulatory) or renal support therapy at Screening or during Visit #1.  X  2.  Any other conditions that in the opinion of the investigators are likely to prevent compliance with the study protocol or pose a safety concern if the subject participates in the study.  X  3. Poor tolerance, namely susceptible to severe skin allergies from ECG adhesive patch application.  X   Protocol: REV H    60 minute start window         Cor device must be applied, and the study initiated, no later than 60 minutes of completing the Echocardiogram                             HH:MM  Echo completion time  11:41  2.   Cor Study start time  11:46   30-Minute execution window  Once Cor Monitoring begins, 3 QT Med ECGs and the 15-minute rest period must be completed within a 30 minute window     HH:MM  3. QT Med ECG Completion time  11:53  4. Start of 15-Min sitting rest period  11:53  5. End of 15-Min rest period  12:18  6. Time of device removal  12:20   *Continue to use the Mobile App Event feature to log the Rest period windows and follow instructions on the EF-ACT Clinical Trial  Patient Instruction Card.  Describe any anomalies in Protocol execution in the Protocol Deviation Log    Residential Zip code [phone] (First 3 digits ONLY)                                           [facility] Informed Consent   Subject Name: Dawn Lyons  Subject met inclusion and exclusion criteria.  The informed consent form, study requirements and expectations were reviewed with the subject. Subject had opportunity to  read consent and questions and concerns were addressed prior to the signing of the consent form.  The subject verbalized understanding of the trial requirements.  The subject agreed to participate in the [facility] EF ACT trial and signed the informed consent at 11:43 on 17-Jun-2024.  The informed consent was obtained prior to performance of any protocol-specific procedures for the subject.  A copy of the signed informed consent was given to the subject and a copy was placed in the subject's medical record.   Dawn Lyons          Current Outpatient Medications:    drospirenone -ethinyl estradiol  (YAZ) 3-0.02 MG tablet, Take 2 tablets by mouth daily for 7 days, then decrease to 1 tab daily by mouth. Skip placebo pills., Disp: 28 tablet, Rfl: 0   ferrous sulfate  325 (65 FE) MG tablet, Take 1 tablet (325 mg total) by mouth 3 (three) times daily with meals., Disp: 90 tablet, Rfl: 0   ondansetron  (ZOFRAN -ODT) 4 MG disintegrating tablet, Take  1 tablet (4 mg total) by mouth every 8 (eight) hours as needed for nausea or vomiting., Disp: 30 tablet, Rfl: 0   Relugolix-Estradiol -Norethind (MYFEMBREE ) 40-1-0.5 MG TABS, Take 1 tablet by mouth daily., Disp: 28 tablet, Rfl: 11   Vitamin D , Ergocalciferol , (DRISDOL ) 1.25 MG (50000 UNIT) CAPS capsule, Take 1 capsule (50,000 Units total) by mouth every 7 (seven) days., Disp: 8 capsule, Rfl: 0

## 2024-08-12 ENCOUNTER — Ambulatory Visit: Admitting: Obstetrics and Gynecology

## 2024-08-27 ENCOUNTER — Ambulatory Visit: Payer: Self-pay | Admitting: Obstetrics and Gynecology

## 2024-09-04 ENCOUNTER — Inpatient Hospital Stay: Payer: Self-pay

## 2024-09-06 NOTE — Progress Notes (Deleted)
 [facility] OFFICE PROGRESS NOTE  Patient Care Team: Pcp, No as PCP - General  Dawn Lyons is a 48 y.o. female with history of AUB, Uterine leiomyoma being seen for iron deficiency anemia.   Relevant history: History of AUB and fibroid. Last colonoscopy: not yet. Periods: heavy Assessment & Plan   No orders of the defined types were placed in this encounter.    Dawn JAYSON Chihuahua, MD  INTERVAL HISTORY: Patient returns for follow-up.  Oncology History   No history exists.     PHYSICAL EXAMINATION: ECOG PERFORMANCE STATUS: {CHL ONC ECOG [phone]}  There were no vitals filed for this visit. There were no vitals filed for this visit.  Relevant data reviewed during this visit included ***

## 2024-09-07 ENCOUNTER — Inpatient Hospital Stay: Payer: Self-pay

## 2024-09-09 ENCOUNTER — Telehealth: Payer: Self-pay

## 2024-09-09 NOTE — Telephone Encounter (Signed)
 Scheduled appointments per staff message. Called and left a VM with appointment details for the patient.

## 2024-09-21 ENCOUNTER — Other Ambulatory Visit: Payer: Self-pay

## 2024-09-24 ENCOUNTER — Inpatient Hospital Stay: Payer: Self-pay

## 2024-09-27 NOTE — Assessment & Plan Note (Deleted)
 CBC, Iron, TIBC, ferritin in 3 - 4 months Continue oral ferrous sulfate  325 mg daily with vit C tab separately from calcium  product like milk, or supplement.

## 2024-09-27 NOTE — Progress Notes (Deleted)
 [facility] OFFICE PROGRESS NOTE  Patient Care Team: Pcp, No as PCP - General  Assessment & Plan Iron deficiency anemia due to chronic blood loss CBC, Iron, TIBC, ferritin in 3 - 4 months Continue oral ferrous sulfate  325 mg daily with vit C tab separately from calcium  product like milk, or supplement.  No orders of the defined types were placed in this encounter.    Dawn JAYSON Chihuahua, MD  INTERVAL HISTORY: Patient returns for follow-up.  Oncology History   No history exists.     PHYSICAL EXAMINATION: ECOG PERFORMANCE STATUS: {CHL ONC ECOG [phone]}  There were no vitals filed for this visit. There were no vitals filed for this visit.  GENERAL: alert, no distress and comfortable SKIN: skin color normal and no jaundice or bruising or petechiae on exposed skin EYES: normal, sclera clear OROPHARYNX: no exudate  NECK: No palpable mass LYMPH:  no palpable cervical, axillary lymphadenopathy  LUNGS: clear to auscultation and no wheeze or rales with normal breathing effort HEART: regular rate & rhythm  ABDOMEN: abdomen soft, non-tender and nondistended. Musculoskeletal: no edema NEURO: no focal motor/sensory deficits  Relevant data reviewed during this visit included labs.  New labs ordered.

## 2024-09-28 ENCOUNTER — Inpatient Hospital Stay: Payer: Self-pay

## 2024-09-28 DIAGNOSIS — D500 Iron deficiency anemia secondary to blood loss (chronic): Secondary | ICD-10-CM

## 2025-03-19 ENCOUNTER — Ambulatory Visit: Admitting: Obstetrics and Gynecology
